# Patient Record
Sex: Female | Born: 1940 | Race: White | Hispanic: No
Health system: Midwestern US, Academic
[De-identification: ages and names within clinical notes are randomized; demographics above are authoritative.]

---

## 2016-11-07 ENCOUNTER — Encounter: Admit: 2016-11-07 | Discharge: 2016-11-07 | Payer: MEDICARE

## 2016-11-07 ENCOUNTER — Ambulatory Visit: Admit: 2016-11-07 | Discharge: 2016-11-08 | Payer: MEDICARE

## 2016-11-07 DIAGNOSIS — I639 Cerebral infarction, unspecified: Principal | ICD-10-CM

## 2016-11-07 DIAGNOSIS — I10 Essential (primary) hypertension: Principal | ICD-10-CM

## 2016-11-07 DIAGNOSIS — E785 Hyperlipidemia, unspecified: ICD-10-CM

## 2016-11-07 DIAGNOSIS — N289 Disorder of kidney and ureter, unspecified: ICD-10-CM

## 2016-11-07 MED ORDER — SPIRONOLACTONE 25 MG PO TAB
50 mg | ORAL_TABLET | Freq: Every day | ORAL | 3 refills | 90.00000 days | Status: AC
Start: 2016-11-07 — End: 2017-02-06

## 2016-11-07 NOTE — Progress Notes
Date of Service: 11/07/2016    Ann Ibarra is a 76 y.o. female.       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  She does not have a device in place so we leaving her on oral anticoagulation because of her stroke risk from recurrent occult A. fib.  When I saw her last I increased her blood pressure meds by adding amlodipine 2.5 mg.  She has had edema develop on higher doses.  I stopped her metoprolol last time because of heart rate in the 40s.  Today her she brings her bandage [place] blood pressure readings in I note no readings below 120 most readings in the 130 range but some into the 140 and even 150 range.  When I saw her last I noted no readings in the 150 range so she is clearly running higher now.    She is not very mobile. She gets around [facility] and is generally satisfied with her quality of life having accepted the limitations.  She has had no new health problems develop since I saw her last.  She has had no sense of recurrence of atrial fib.    She states that she is free from exertional chest pain or pressure that limits activity.  She is having no PND, orthopnea,  edema, tachypalpitations, syncope, near syncope, or postural lightheadedness.  She has had no  focal motor defects, speech defects or cognitive defects that would suggest TIA. She is not limiting her activity in order to avoid chest discomfort or shortness of breath with exertion.         Vitals:    11/07/16 1031 11/07/16 1107   BP: 158/84 162/86   Pulse: 48    Height: 1.626 m (5' 4)      There is no height or weight on file to calculate BMI.     Past Medical History  Patient Active Problem List    Diagnosis Date Noted   ??? Bilateral leg edema 03/09/2015   ??? Persistent atrial fibrillation (HCC) 12/08/2014 atrial fibrillation rapid ventricular response 07/2014 on admitting EKG Strep pneumonia sepsis  [facility]   atrial fibrillation controlled ventricular response MAC OV 12/08/14.  on diltiazem 240, lopressor 25 BID.       ??? Bradycardia, drug induced 11/26/2013     11/26/13 progress note~Pt took excess of labetalol (900mg  instead of 300mg ) and syncope + LOC which has since resolved   01/06/14 Sinus bradycardia rate 45 BP 160/90 on labetalol 100 mg BID, Labetalol DC'd.  MAC OV Atch.     ??? 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  2/02-CT of chest/abdomen:Ao stable in size, 3.8cm AP x 3.4cm transverse stable  c.  06/20/00 CT chest abdomen: Flap visible distal to L subclavian to infrarenal, no change vs 2/02  d.  7/05 CT chest abdomen Ascending: 4 cm,  descending 3.5 cm stable vs 4/04  e.12/10/04 CT Chest/abd: Ascending 4.0, descending 3.5 cm max spans same as 7/05  f. 04/06/06 CT aorta: ascending: 4.0 cm dia, descending 3.5 cm w/ stable Type B dissection,  Prox abd. aorta 2.9 cm  No change when compared to prior 7/06 CT survey  G. August, 2011. The max dia   4.09 cm, stable Type B dissection.   H 07/2012 CT Chest AtchHosp: 4.1 cm ascending aorta, Stable Type B Aortic Dissection  I 02/24/14:  CT Chest Atch: Stable aortic dissection Max Dia Ascending Ao 4.0 cm, Descending thoracic  Ao 3.8 to 4.0 unchanged from 07/2012.   I 3/16 Stable aorta CT chest  [facility] w/ admit eval for pneumonia.          ??? 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  Initial defects: R Hemianopia, hemiparesis,ataxia. Reduced intellectual capacity> CarotidUS-OK b.  2002 Residual defects in reflexes, higher mental function, memory and common sense     ??? Hypertension 10/21/2008      a.  1/02 BP 160+, Zestril increased to 40 BID. labetalol, norvasc, cardura cont'd. BP to 110-20       b.  Began donating blood multiple times annually in 2002, BP normal at donations.  C. 03/2011 Labetalol 600 BID reduced to 300 BID when resting heart rate noted to be 41 on routine MAC OV.      ??? Hyperlipidemia 10/21/2008     a.   7/00-Total 194, Trig 150, HDL 50, LDL 114-Zocor 10mg        b.  03/28/00 total 161 trig 131 HDL 51 LDL 84 AST/ALT ok, Zocor 10,             >change to lipitor 10 mg/d 5/02 for cost reduction       c.  11/23/00 total 144 trig 109 HDL 53 LDL 69  Lipitor 10  D. 08/24/09 total 153 trigs 108 HDL 54 LDL 78 Lipitor 20     ??? Observation for suspected coronary artery disease (CAD) 10/21/2008     a.  07/14/2001 2.5 minute stress echo stopping with legs giving out Submaximal heart rate,             EF 70+ % with improvement with exercise, no ischemia. 6.  GI issues Hiatal hernia       a.  2/02-CT scan of chest small hiatal hernia, abdomen: descending colon diverticulosis     ??? Renal insufficiency 10/21/2008     a.  5/99 Abnormal renal flow scan [facility]: bilateral insufficiency, Left lower pole atrophy               Review of Systems   Constitution: Negative.   HENT: Negative.    Eyes: Negative.    Cardiovascular: Negative.    Respiratory: Negative.    Endocrine: Negative.    Hematologic/Lymphatic: Negative.    Skin: Negative.    Musculoskeletal: Negative.    Gastrointestinal: Negative.    Genitourinary: Negative.    Neurological: Negative.    Psychiatric/Behavioral: Negative.    Allergic/Immunologic: Negative.        Physical Exam  Examined  in wheelchair,   General: Patient in no distress, looks generally healthy. Skin warm and dry, non icteric,    Mucous membranes moist.  Pupils equal and round  Carotids: no bruits    Thyroid not enlarged.  Neck veins: CVP looks normal Respiratory: Breathing comfortably. Lungs clear to percussion . No rales/rhonchi. Scattered late expiratory wheezes.  Cardiac: Regular rhythm.Normal S1 & S2, Fourth heart sound, no rub or S3. No murmur  Abdomen: soft, non-tender,  + bowel sounds.   Motor: Normal muscle strength gaint not tested.  No asymmetric weakness.   Cognitive: Pleasant demeanor. Good insight. No depression   She has 1-2 mm low right leg and ankle edema this is more than the trace I described last visit she also has mild edema on the left not quite as much as on the right.    Cardiovascular Studies  EKG today shows sinus bradycardia rate 48 late transition.    Problems Addressed Today  No diagnosis found.    Assessment and Plan     Ann Ibarra blood pressure is not adequately controlled.  Going to have her double her spironolactone to 50 mg which should help alleviate some of the edema and improve blood pressure control.  Her edema is a bit worse now than it was last visit and I would attribute this to the amlodipine 2.5 mg.  I do not want to boost the amlodipine in this setting.  Hopefully the edema will regress with increased Aldactone.  I have her check a BMP in 10 days and see me again in 3 months for repeat check of blood pressure.  She has issues that we can I can be contacted by the [facility] staff.  Will as we just discussed her blood pressure control looked better last time that it does this time.  The amlodipine 2.5 mg that started last visit is likely contributing to your leg swelling.  I do not think is enough to make it stop the amlodipine but I do not want to increase.    NB: This document was prepared within the Epic(TM) EMR using templates developed by the [facility]. It can be accessed online through [facility] feature by clinicians at other Epic institutions.  The free text in this document, was generated through Dragon(TM) software.  Editing and proofreading were done by the author of this document Dr. Mable Paris MD, [facility] principally at the point of care.  In spite of the author's best effort to identify every air introduced by voice to text dictation, some errors that may represent misspelling or misstatements of what was dictated may persist.  If there are questions about content in this document please contact Dr. Hale Bogus.    The written information I provided Ann Ibarra at the conclusion of today's encounter is as  follows:    Patient Instructions   Her blood pressure control is not good enough and you have more edema than last visit this reason I do not want you to increase the amlodipine past 2.5 mg you are currently taking.    Increase the Aldactone/spironolactone from 25 mg daily to 50 mg daily this should reduce blood pressure and help clear ankle fluid.    You need a BMP blood test in about 10 days to see how your potassium reading looks on the higher dose Spironolactone.    See me again in about 3 months for recheck of blood pressure and assessing chemistry have a BMP blood test a day or 2 before you come in to see me so I can see those results as well in September.    Call in if you have problems or questions.   Marissa Nestle, MD  no checked on the computer at right I think were good problems will make to copies of the printout so they can at 1 2 up to [facility] used this dictation I get everything done  every dictate a symptom off track thinks it otherwise please about as long to do that at the estimate notes and read them later so it is faster or accurate but I have to get the computer to work this is the only computer work is only computer microphone so the emergency room the day before your birthday             Current Medications (including today's revisions)  ??? acetaminophen (TYLENOL) 325 mg tablet Take 650 mg by mouth twice daily.   ??? amLODIPine (NORVASC) 2.5 mg tablet Take 1 tablet by mouth daily. ??? atorvastatin (LIPITOR) 20 mg tablet Take 20 mg by mouth daily.   ??? benzonatate (TESSALON) 200 mg capsule Take 200 mg by mouth three times daily as needed for Cough.   ??? diphenhydrAMINE (BENADRYL) 50 mg capsule Take 50 mg by mouth every 6 hours as needed.   ??? docusate (COLACE) 100 mg capsule Take 100 mg by mouth twice daily as needed for Constipation.   ??? doxazosin (CARDURA) 2 mg tablet Take 1 Tab by mouth twice daily.   ??? ferrous sulfate 325 mg (65 mg iron) tablet Take 1 Tab by mouth three times daily. On 7/19, start taking three times a dya (Patient taking differently: Take 325 mg by mouth daily. On 7/19, start taking three times a dya)   ??? furosemide (LASIX) 40 mg tablet Take 40 mg by mouth every morning.   ??? guaiFENesin (ROBITUSSIN) 100 mg/5 mL oral solution Take 5 mL by mouth every 4 hours as needed. Indications: COUGH   ??? guaiFENesin LA (MUCINEX) 600 mg tablet Take 600 mg by mouth twice daily.   ??? HYDROcodone/acetaminophen (NORCO) 5-325 mg tablet Take 1 Tab by mouth three times daily as needed for Pain    ??? ipratropium (ATROVENT) 0.02 % nebulizer solution Inhale 0.5 mg by mouth every 2 hours as needed.   ??? LOSARTAN PO Take 100 mg by mouth daily.   ??? MAGNESIUM HYDROXIDE (MILK OF MAGNESIA PO) Take 10 mL by mouth daily as needed.   ??? melatonin 3 mg tab Take 3 mg by mouth at bedtime as needed.   ??? metoprolol tartrate (LOPRESSOR) 25 mg tablet Take 0.5 tablets by mouth twice daily.   ??? rivaroxaban (XARELTO) 20 mg tablet Take 1 Tab by mouth daily.   ??? spironolactone (ALDACTONE) 25 mg tablet Take 1 tablet by mouth daily. Take with food.   ??? vitamin A and D oint Apply  topically to affected area twice daily.

## 2016-11-13 ENCOUNTER — Encounter: Admit: 2016-11-13 | Discharge: 2016-11-13 | Payer: MEDICARE

## 2016-11-13 MED ORDER — XARELTO 20 MG PO TAB
ORAL_TABLET | Freq: Every day | ORAL | 11 refills | 30.00000 days | Status: AC
Start: 2016-11-13 — End: 2017-10-26

## 2016-12-12 ENCOUNTER — Encounter: Admit: 2016-12-12 | Discharge: 2016-12-12 | Payer: MEDICARE

## 2016-12-12 MED ORDER — DOXAZOSIN 2 MG PO TAB
2 mg | ORAL_TABLET | Freq: Two times a day (BID) | ORAL | 3 refills | 30.00000 days | Status: AC
Start: 2016-12-12 — End: 2017-11-26

## 2017-01-16 LAB — COMPREHENSIVE METABOLIC PANEL
Lab: 0.7
Lab: 13
Lab: 145
Lab: 3.9
Lab: 6.4
Lab: 7
Lab: 91

## 2017-01-16 LAB — CBC: Lab: 6.4

## 2017-01-16 LAB — LIPID PROFILE
Lab: 132 — ABNORMAL LOW (ref 150–200)
Lab: 36 — ABNORMAL LOW (ref 23–31)
Lab: 37 — ABNORMAL LOW (ref 33.0–37.0)
Lab: 4
Lab: 58 — ABNORMAL HIGH (ref 9.8–20.1)

## 2017-01-16 LAB — HEMOGLOBIN A1C: Lab: 5.4 — ABNORMAL HIGH (ref 98–107)

## 2017-01-23 ENCOUNTER — Encounter: Admit: 2017-01-23 | Discharge: 2017-01-23 | Payer: MEDICARE

## 2017-02-06 ENCOUNTER — Ambulatory Visit: Admit: 2017-02-06 | Discharge: 2017-02-07 | Payer: MEDICARE

## 2017-02-06 ENCOUNTER — Encounter: Admit: 2017-02-06 | Discharge: 2017-02-06 | Payer: MEDICARE

## 2017-02-06 DIAGNOSIS — I10 Essential (primary) hypertension: Principal | ICD-10-CM

## 2017-02-06 DIAGNOSIS — I639 Cerebral infarction, unspecified: Principal | ICD-10-CM

## 2017-02-06 DIAGNOSIS — N289 Disorder of kidney and ureter, unspecified: ICD-10-CM

## 2017-02-06 DIAGNOSIS — E785 Hyperlipidemia, unspecified: ICD-10-CM

## 2017-02-06 DIAGNOSIS — R001 Bradycardia, unspecified: Secondary | ICD-10-CM

## 2017-02-06 DIAGNOSIS — R600 Localized edema: ICD-10-CM

## 2017-02-06 MED ORDER — METOPROLOL TARTRATE 25 MG PO TAB
12.5 mg | ORAL_TABLET | Freq: Every day | ORAL | 3 refills | 90.00000 days | Status: AC
Start: 2017-02-06 — End: 2017-03-12

## 2017-02-06 MED ORDER — SPIRONOLACTONE 25 MG PO TAB
75 mg | ORAL_TABLET | Freq: Every day | ORAL | 3 refills | 90.00000 days | Status: AC
Start: 2017-02-06 — End: 2017-09-14

## 2017-02-06 NOTE — Progress Notes
Date of Service: 02/06/2017    Ann Ibarra is a 76 y.o. female.       HPI     Ann Ibarra presents today for evaluation after I had her add higher dose of Aldactone to better control blood pressure and reduce edema.  Her blood pressure readings where she she is staying have improved.  Today's reading at 132/80 in the left arm is exactly 30 points lower than the systolic previously obtained with reading on the right arm can confirmatory at 128/78.  She is having no ill effects from the Aldactone.  She notes that her blood pressure readings at [facility] have also been quite satisfactory with the predominant readings in the 120s and 130s and only very occasional readings over 140 she is not having any readings under 100.  It has been noted to her by the night nurses at [facility] that she has slow heart rates that the nursing staff has hoped could be managed by reducing her beta-blocker.    Note in the past that she has had bradycardia down to the heart rates in the 40s 3 years ago when she was on labetalol 100 twice daily.  I subsequently switched her to Lopressor which she is taking in a minimal dose now 12.5 twice daily.         Vitals:    02/06/17 1010 02/06/17 1024   BP: 128/78 132/80   Pulse:  43   Height: 1.626 m (5' 4)      Body mass index is 37.93 kg/m???.     Past Medical History  Patient Active Problem List    Diagnosis Date Noted   ??? Bilateral leg edema 03/09/2015   ??? Persistent atrial fibrillation (HCC) 12/08/2014     atrial fibrillation rapid ventricular response 07/2014 on admitting EKG Strep pneumonia sepsis  [facility]   atrial fibrillation controlled ventricular response MAC OV 12/08/14.  on diltiazem 240, lopressor 25 BID.       ??? Bradycardia, drug induced 11/26/2013     11/26/13 progress note~Pt took excess of labetalol (900mg  instead of 300mg ) and syncope + LOC which has since resolved   01/06/14 Sinus bradycardia rate 45 BP 160/90 on labetalol 100 mg BID, Labetalol DC'd.  MAC OV Atch. ??? 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  2/02-CT of chest/abdomen:Ao stable in size, 3.8cm AP x 3.4cm transverse stable  c.  06/20/00 CT chest abdomen: Flap visible distal to L subclavian to infrarenal, no change vs 2/02  d.  7/05 CT chest abdomen Ascending: 4 cm,  descending 3.5 cm stable vs 4/04  e.12/10/04 CT Chest/abd: Ascending 4.0, descending 3.5 cm max spans same as 7/05  f. 04/06/06 CT aorta: ascending: 4.0 cm dia, descending 3.5 cm w/ stable Type B dissection,                Prox abd. aorta 2.9 cm  No change when compared to prior 7/06 CT survey  G. August, 2011. The max dia   4.09 cm, stable Type B dissection.   H 07/2012 CT Chest AtchHosp: 4.1 cm ascending aorta, Stable Type B Aortic Dissection  I 02/24/14:  CT Chest Atch: Stable aortic dissection Max Dia Ascending Ao 4.0 cm, Descending thoracic  Ao 3.8 to  4.0 unchanged from 07/2012.   I 3/16 Stable aorta CT chest  [facility] w/ admit eval for pneumonia.          ??? 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  Initial defects: R Hemianopia, hemiparesis,ataxia. Reduced intellectual capacity> CarotidUS-OK       b.  2002 Residual defects in reflexes, higher mental function, memory and common sense     ??? Hypertension 10/21/2008      a.  1/02 BP 160+, Zestril increased to 40 BID. labetalol, norvasc, cardura cont'd. BP to 110-20       b.  Began donating blood multiple times annually in 2002, BP normal at donations.  C. 03/2011 Labetalol 600 BID reduced to 300 BID when resting heart rate noted to be 41 on routine MAC OV.      ??? Hyperlipidemia 10/21/2008     a.   7/00-Total 194, Trig 150, HDL 50, LDL 114-Zocor 10mg  b.  03/28/00 total 161 trig 131 HDL 51 LDL 84 AST/ALT ok, Zocor 10,             >change to lipitor 10 mg/d 5/02 for cost reduction       c.  11/23/00 total 144 trig 109 HDL 53 LDL 69  Lipitor 10  D. 08/24/09 total 153 trigs 108 HDL 54 LDL 78 Lipitor 20     ??? Observation for suspected coronary artery disease (CAD) 10/21/2008     a.  07/14/2001 2.5 minute stress echo stopping with legs giving out Submaximal heart rate,             EF 70+ % with improvement with exercise, no ischemia. 6.  GI issues Hiatal hernia       a.  2/02-CT scan of chest small hiatal hernia, abdomen: descending colon diverticulosis     ??? Renal insufficiency 10/21/2008     a.  5/99 Abnormal renal flow scan [facility]: bilateral insufficiency, Left lower pole atrophy               Review of Systems   Constitution: Negative.   HENT: Negative.    Eyes: Negative.    Cardiovascular: Negative.    Respiratory: Negative.    Endocrine: Negative.    Hematologic/Lymphatic: Negative.    Skin: Negative.    Musculoskeletal: Negative.    Gastrointestinal: Negative.    Genitourinary: Negative.    Neurological: Negative.    Psychiatric/Behavioral: Negative.    Allergic/Immunologic: Negative.        Physical Exam  Examined  in wheelchair,   General: Patient in no distress, looks generally healthy. Skin warm and dry, non icteric,    Mucous membranes moist.  Pupils equal and round  Carotids: no bruits    Thyroid not enlarged.  Neck veins: CVP looks normal  Respiratory: Breathing comfortably. Lungs clear to percussion . No rales/rhonchi. Scattered late expiratory wheezes.  Cardiac: Basically slow regular rhythm with occasional prematurity..Normal S1 & S2, Fourth heart sound, no rub or S3. No murmur  Abdomen: soft, non-tender,  + bowel sounds.   Motor: Normal muscle strength gaint not tested.  No asymmetric weakness.   Cognitive: Pleasant demeanor. Good insight. No depression   She has minimal bilateral edema.  Its most prominent right above the top of her compression socks.  Cardiovascular Studies  Her chemistry from September 4 that shows mid range potassium  and satisfactory creatinine.   01/16/2017 00:00   Sodium 145   Potassium 4.5   Chloride 112 (H)   CO2 22.0 (L)   Anion Gap 16 (H)   Blood Urea Nitrogen 35.0 (H)   Creatinine 1.03   eGFR Non African American 52.9   eGFR African American 61.0   Glucose 164 (H)   Albumin 3.9   Calcium 9.7   Total Bilirubin 0.70   Total Protein 6.4   Hemoglobin A1C 5.4   AST (SGOT) 13   ALT (SGPT) 7   Alk Phosphatase 91   Cholesterol 132 (L)   Triglycerides 183   HDL 36   LDL 58       Problems Addressed Today  No diagnosis found.    Assessment and Plan     Mrs. Chalmers has well-controlled blood pressure but her heart rate is steadily slowing.  She is getting new alerts from her staff at [facility] about bradycardia at night.  Even the she is asymptomatic at this point it would take a little to cause alarms if she indicated any symptoms at all referable to bradycardia.     With her bradycardia is extremely difficult to find alternative drugs to add as we have already taken her off of labetalol because of bradycardia which is a combination vasodilator beta-blocker with very little beta-blocker effect.      In this setting I am going to reluctantly reduce her Lopressor to 12.5 mg a.m. and add Aldactone is slightly higher dose increased to 75 mg daily to try to maintain blood pressure and heart rate more satisfactorily.  Signed an order on her follow-up sheet for her to have BMP readings in 10 days and 20 days to track potassium on the slightly higher dose of Aldactone.  I will see her again in 2 months sooner if the need arises.      NB: This document was prepared within the Epic(TM) EMR using templates developed by the [facility]. It can be accessed online through [facility] feature by clinicians at other Epic institutions.  The free text in this document, was generated through Dragon(TM) software.  Editing and proofreading were done by the author of this document Dr. Mable Paris MD, [facility] principally at the point of care.  In spite of the author's best effort to identify every error introduced by voice to text dictation, some errors that may represent misspelling or misstatements of what was dictated may persist.  If there are questions about content in this document please contact Dr. Hale Bogus.    The written information I provided Ms. Broski at the conclusion of today's encounter is as  follows:   Patient Instructions   Your heart rate has been declining steadily over the last couple of years.  I can see the handwriting on the wall that the heart rates going to drop down or you will have a heart rate in the 40s with a little dizziness and the [place] people will tell you you have to stop the Lopressor and though quit giving it to you.  What I want to do is reduce the Lopressor to a simple 12.5 mg dose each morning and have you increase the Aldactone to 3 tablets daily.  Those 2 to get those 2 changes together may leave you with a slightly higher heart rate and the same blood pressure control.  We need to check your chemistry in 10 days and 20 days to see  what your potassium is doing on the higher dose of Aldactone.    Your leg swelling looks good everything else seems fine.    All hope that this works out.    Return for a recheck in two months.    I can see you sooner if needed.   Call in if you have problems or questions.  My nurses are Brett Canales or Misty Stanley, try to reach   directly if something really needs attention  Marissa Nestle, MD                 Current Medications (including today's revisions)  ??? acetaminophen (TYLENOL) 325 mg tablet Take 650 mg by mouth twice daily.   ??? acetaminophen SR(+) (ARTHRITIS PAIN RELIEF) 650 mg tablet Take 650 mg by mouth every 6 hours as needed for Pain.   ??? amLODIPine (NORVASC) 2.5 mg tablet Take 1 tablet by mouth daily. ??? atorvastatin (LIPITOR) 20 mg tablet Take 20 mg by mouth daily.   ??? benzonatate (TESSALON) 200 mg capsule Take 200 mg by mouth three times daily as needed for Cough.   ??? diphenhydrAMINE (BENADRYL) 50 mg capsule Take 50 mg by mouth every 6 hours as needed.   ??? docusate (COLACE) 100 mg capsule Take 100 mg by mouth twice daily as needed for Constipation.   ??? doxazosin (CARDURA) 2 mg tablet Take 1 tablet by mouth twice daily.   ??? ferrous sulfate 325 mg (65 mg iron) tablet Take 1 Tab by mouth three times daily. On 7/19, start taking three times a dya (Patient taking differently: Take 325 mg by mouth daily. On 7/19, start taking three times a dya)   ??? furosemide (LASIX) 40 mg tablet Take 40 mg by mouth every morning.   ??? guaiFENesin (ROBITUSSIN) 100 mg/5 mL oral solution Take 5 mL by mouth every 4 hours as needed. Indications: COUGH   ??? guaiFENesin LA (MUCINEX) 600 mg tablet Take 600 mg by mouth twice daily.   ??? HYDROcodone/acetaminophen (NORCO) 5-325 mg tablet Take 1 Tab by mouth three times daily as needed for Pain    ??? ipratropium (ATROVENT) 0.02 % nebulizer solution Inhale 0.5 mg by mouth every 2 hours as needed.   ??? loperamide (IMODIUM A-D) 2 mg capsule Take 2 mg by mouth as Needed for Diarrhea. Take 2 capsules by mouth initially, followed by 1 capsule by mouth after each loose stool up to a maximum of 8 tablets in 24 hours.   ??? LOSARTAN PO Take 100 mg by mouth daily.   ??? MAGNESIUM HYDROXIDE (MILK OF MAGNESIA PO) Take 10 mL by mouth daily as needed.   ??? melatonin 3 mg tab Take 3 mg by mouth at bedtime as needed.   ??? metoprolol tartrate (LOPRESSOR) 25 mg tablet Take 0.5 tablets by mouth twice daily.   ??? spironolactone (ALDACTONE) 25 mg tablet Take 2 tablets by mouth daily. Take with food.   ??? vitamin A and D oint Apply  topically to affected area twice daily.   ??? XARELTO 20 mg tablet TAKE 1 TABLET BY MOUTH DAILY.

## 2017-02-15 LAB — BASIC METABOLIC PANEL
Lab: 0.7
Lab: 112 — ABNORMAL HIGH (ref 98–107)
Lab: 119 — ABNORMAL HIGH (ref 83–110)
Lab: 14
Lab: 143
Lab: 21 — ABNORMAL HIGH (ref 9.8–20.1)
Lab: 21 — ABNORMAL LOW (ref 23–31)
Lab: 4
Lab: 9.3

## 2017-02-27 ENCOUNTER — Encounter: Admit: 2017-02-27 | Discharge: 2017-02-27 | Payer: MEDICARE

## 2017-02-27 DIAGNOSIS — I10 Essential (primary) hypertension: Principal | ICD-10-CM

## 2017-02-27 DIAGNOSIS — R001 Bradycardia, unspecified: ICD-10-CM

## 2017-02-27 LAB — BASIC METABOLIC PANEL
Lab: 22 — ABNORMAL HIGH (ref 9.8–20.1)
Lab: 4.3
Lab: 110 — ABNORMAL HIGH (ref 98–107)
Lab: 143
Lab: 22 — ABNORMAL LOW (ref 23–31)

## 2017-03-12 ENCOUNTER — Encounter: Admit: 2017-03-12 | Discharge: 2017-03-12 | Payer: MEDICARE

## 2017-03-12 MED ORDER — METOPROLOL TARTRATE 25 MG PO TAB
ORAL_TABLET | Freq: Every morning | ORAL | 3 refills | 90.00000 days | Status: AC
Start: 2017-03-12 — End: 2017-06-18

## 2017-03-27 ENCOUNTER — Encounter: Admit: 2017-03-27 | Discharge: 2017-03-27 | Payer: MEDICARE

## 2017-03-27 DIAGNOSIS — R001 Bradycardia, unspecified: ICD-10-CM

## 2017-03-27 DIAGNOSIS — I10 Essential (primary) hypertension: Principal | ICD-10-CM

## 2017-04-24 ENCOUNTER — Ambulatory Visit: Admit: 2017-04-24 | Discharge: 2017-04-25 | Payer: MEDICARE

## 2017-04-24 ENCOUNTER — Encounter: Admit: 2017-04-24 | Discharge: 2017-04-24 | Payer: MEDICARE

## 2017-04-24 DIAGNOSIS — I10 Essential (primary) hypertension: ICD-10-CM

## 2017-04-24 DIAGNOSIS — E785 Hyperlipidemia, unspecified: ICD-10-CM

## 2017-04-24 DIAGNOSIS — N289 Disorder of kidney and ureter, unspecified: ICD-10-CM

## 2017-04-24 DIAGNOSIS — R001 Bradycardia, unspecified: Principal | ICD-10-CM

## 2017-04-24 DIAGNOSIS — I4819 Other persistent atrial fibrillation: ICD-10-CM

## 2017-04-24 DIAGNOSIS — I639 Cerebral infarction, unspecified: Principal | ICD-10-CM

## 2017-04-24 NOTE — Progress Notes
Date of Service: 04/24/2017    Ann Ibarra is a 76 y.o. female.       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  [facility] is now at [facility].  She is nonambulatory but feels that she is adapted to her limitations well and has a pretty good quality of life.  She has had an upper respiratory tract a infection syndrome with persisting cough that has not resolved in spite of Mucomyst and benzonatate.  In reviewing her med list I see that she is on losartan for hypertension and not an ACE inhibitor.  She has had no fevers chills or sweats.  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  The cough is nonproductive.    When I saw her 3 months ago I was concerned with her heart rate 43 in sinus rhythm.  I had her cut her Toprol back in increased her Aldactone.  She is done well on that program.    Other than this her health care status since I seen her last he has been uneventful.  She has had difficult to control hypertension in the past but on the current program we have established over time her pressures are generally in the 130 range at [facility].  When she saw that her reading was 158/88 with initial recheck couple of minutes later 152/84 she thought this was anxiety related artifact from my trip to the hospital.  On recheck 30 minutes later by the same med tech who I checked her initially her pressures were down 20 points to 130.    I reviewed her imaging records and see that her last check of her ascending aorta by CT was in early 2016.  The aorta was stable with readings in the 4.0 cm range essentially what we have seen over the last many years.  She has had no symptoms suggesting aortic instability.  She has a distant history of atrial fibrillation but no current antiarrhythmic or anticoagulant therapy.         Vitals:    04/24/17 1254 04/24/17 1258   BP: 158/88 152/84   Pulse: 63    Height: 1.626 m (5' 4)      Body mass index is 37.93 kg/m???.     Past Medical History  Patient Active Problem List    Diagnosis Date Noted   ??? Bilateral leg edema 03/09/2015   ??? Persistent atrial fibrillation (HCC) 12/08/2014     atrial fibrillation rapid ventricular response 07/2014 on admitting EKG Strep pneumonia sepsis  [facility]   atrial fibrillation controlled ventricular response MAC OV 12/08/14.  on diltiazem 240, lopressor 25 BID.       ??? Bradycardia, drug induced 11/26/2013     11/26/13 progress note~Pt took excess of labetalol (900mg  instead of 300mg ) and syncope + LOC which has since resolved   01/06/14 Sinus bradycardia rate 45 BP 160/90 on labetalol 100 mg BID, Labetalol DC'd.  MAC OV [facility].     ??? 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  2/02-CT of chest/abdomen:Ao stable in size, 3.8cm AP x 3.4cm transverse stable  c.  06/20/00 CT chest abdomen: Flap visible distal to L subclavian to infrarenal, no change vs 2/02  d.  7/05 CT chest abdomen Ascending: 4 cm,  descending 3.5 cm stable vs 4/04  e.12/10/04 CT Chest/abd: Ascending 4.0, descending 3.5 cm max spans same as 7/05  f. 04/06/06 CT aorta: ascending: 4.0 cm dia, descending 3.5 cm w/ stable Type B dissection,                Prox abd. aorta 2.9 cm  No change when compared to prior 7/06 CT survey  G. August, 2011. The max dia   4.09 cm, stable Type B dissection.   H 07/2012 CT Chest [facility]: 4.1 cm ascending aorta, Stable Type B Aortic Dissection  I 02/24/14:  CT Chest [facility]: Stable aortic dissection Max Dia Ascending Ao 4.0 cm, Descending thoracic  Ao 3.8 to 4.0 unchanged from 07/2012.   I 3/16 Stable aorta CT chest  [facility] w/ admit eval for pneumonia.          ??? 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  Initial defects: R Hemianopia, hemiparesis,ataxia. Reduced intellectual capacity> CarotidUS-OK       b.  2002 Residual defects in reflexes, higher mental function, memory and common sense     ??? Hypertension 10/21/2008      a.  1/02 BP 160+, Zestril increased to 40 BID. labetalol, norvasc, cardura cont'd. BP to 110-20       b.  Began donating blood multiple times annually in 2002, BP normal at donations.  C. 03/2011 Labetalol 600 BID reduced to 300 BID when resting heart rate noted to be 41 on routine MAC OV.      ??? Hyperlipidemia 10/21/2008     a.   7/00-Total 194, Trig 150, HDL 50, LDL 114-Zocor 10mg        b.  03/28/00 total 161 trig 131 HDL 51 LDL 84 AST/ALT ok, Zocor 10,             >change to lipitor 10 mg/d 5/02 for cost reduction       c.  11/23/00 total 144 trig 109 HDL 53 LDL 69  Lipitor 10  D. 08/24/09 total 153 trigs 108 HDL 54 LDL 78 Lipitor 20     ??? Observation for suspected coronary artery disease (CAD) 10/21/2008     a.  07/14/2001 2.5 minute stress echo stopping with legs giving out Submaximal heart rate,             EF 70+ % with improvement with exercise, no ischemia. 6.  GI issues Hiatal hernia       a.  2/02-CT scan of chest small hiatal hernia, abdomen: descending colon diverticulosis     ??? Renal insufficiency 10/21/2008     a.  5/99 Abnormal renal flow scan [facility]: bilateral insufficiency, Left lower pole atrophy               Review of Systems   Constitution: Negative.   HENT: Positive for congestion.    Eyes: Negative.    Cardiovascular: Negative.    Respiratory: Positive for cough and wheezing.    Endocrine: Negative.    Hematologic/Lymphatic: Negative.    Skin: Negative. Musculoskeletal: Negative.    Gastrointestinal: Negative.  Genitourinary: Negative.    Neurological: Negative.    Psychiatric/Behavioral: Negative.    Allergic/Immunologic: Negative.        Physical Exam  Examined  in wheelchair,   General: Patient in no distress, looks generally healthy. Skin warm and dry, non icteric,    Mucous membranes moist.  Pupils equal and round  Carotids: no bruits    Thyroid not enlarged.  Neck veins: CVP looks normal  Respiratory: Breathing comfortably. Lungs clear to percussion . No rales/rhonchi. Scattered late expiratory wheezes.  Cardiac: Basically slow regular rhythm with occasional prematurity..Normal S1 & S2, Fourth heart sound, no rub or S3. No murmur  Abdomen: soft, non-tender,  + bowel sounds.   Motor: Normal muscle strength gaint not tested.  No asymmetric weakness.   Cognitive: Pleasant demeanor. Good insight. No depression   She has minimal if any leg edema. She is wearing tight socke to upper calf not  compression socks    Cardiovascular Studies  EKG shows sinus rhythm rate 63 delayed R wave transition otherwise unremarkable.    Problems Addressed Today  No diagnosis found.    Assessment and Plan     Choline his heart rate is now above 60 with minimal beta-blocker residual dosing.  At some point when her heart rate drops into the 40s or 50s on the Toprol-XL 12.5 will discontinue it but for now her blood pressure control is good and I do not want to change anything.      With her heart rate up 15-20 beats from where she was when I saw her in September and blood pressures consistently in the 130s at [facility] and dropped to 130 from 150 today in clinic after she did settle down there is no reason to alter her blood pressure meds.    As we're approaching 20 years out from initial follow-up of her dilated ascending aorta  with dissection there is no clear guideline on optimal timing of interval CTs.  I will let her wait until next spring when I see her in about 6 months for her next CT scan.  That will be about 3 years after her last scan but with minimal evidence of progressive disease and dilation over the past 17 years there is no reason to think that we need to get the scan more promptly than on about the 20th anniversary of her aortic dissection in the.  I will see her again in May 2019.  I like her to get a CT scan without contrast of her thoracic aorta shortly before that so I will have the report available when I see her.  She will continue to have her blood pressure and heart rate monitored at [facility].    I do not think there is anything to do about her bronchitis syndrome.  I do not think she needs antibiotics for this and neither does she.      NB: The free text in this document was generated through Dragon(TM) software with editing and proofreading  done by the author of this document Dr. Mable Paris MD, [facility] principally at the point of care. Some errors may persist.  If there are questions about content in this document please contact Dr. Hale Bogus.    The written information I provided Ms. Mcclenathan at the conclusion of today's encounter is as  follows:   Patient Instructions   Overall things look good.  Your heart rate is up and your blood pressure is down.  I am not  changing any of your medications.  I thought the beta-blocker was slowing her heart down too much.  With slight reduction your heart rates in the 60s now instead of the 40s last visit.    Need to have a CT scan next spring.  Having this done in April or May will be about 20years since her initial stroke.  I think at the time I started following you in 1999 the idea that you would go 20 years with no further complications would have been a hope that we would have thought would likely not come true.    Let Dr. Larina Bras know if you start having a fever or coughing up of the phlegm.  Right now I think you just have bronchitis.  It is best to avoid decongestants since those can raise your heart rate and blood pressure in an unpredictable manner.    I look forward to seeing you again in May with a CT scan having been done a week or 2 before hand so I will have the results.  If something goes wrong with heart rate blood pressure anything else Dr. Larina Bras thinks I should see you about I will be happy to do so early.                 Current Medications (including today's revisions)  ??? acetaminophen (TYLENOL) 325 mg tablet Take 650 mg by mouth twice daily.   ??? acetaminophen SR(+) (ARTHRITIS PAIN RELIEF) 650 mg tablet Take 650 mg by mouth every 6 hours as needed for Pain.   ??? amLODIPine (NORVASC) 2.5 mg tablet Take 1 tablet by mouth daily.   ??? atorvastatin (LIPITOR) 20 mg tablet Take 20 mg by mouth daily.   ??? benzonatate (TESSALON) 200 mg capsule Take 200 mg by mouth three times daily as needed for Cough.   ??? diphenhydrAMINE (BENADRYL) 50 mg capsule Take 50 mg by mouth every 6 hours as needed.   ??? docusate (COLACE) 100 mg capsule Take 100 mg by mouth twice daily as needed for Constipation.   ??? doxazosin (CARDURA) 2 mg tablet Take 1 tablet by mouth twice daily.   ??? furosemide (LASIX) 40 mg tablet Take 40 mg by mouth every morning.   ??? guaiFENesin (ROBITUSSIN) 100 mg/5 mL oral solution Take 5 mL by mouth every 4 hours as needed. Indications: COUGH   ??? guaiFENesin LA (MUCINEX) 600 mg tablet Take 600 mg by mouth twice daily.   ??? HYDROcodone/acetaminophen (NORCO) 5-325 mg tablet Take 1 Tab by mouth three times daily as needed for Pain    ??? ipratropium (ATROVENT) 0.02 % nebulizer solution Inhale 0.5 mg by mouth every 2 hours as needed.   ??? loperamide (IMODIUM A-D) 2 mg capsule Take 2 mg by mouth as Needed for Diarrhea. Take 2 capsules by mouth initially, followed by 1 capsule by mouth after each loose stool up to a maximum of 8 tablets in 24 hours.   ??? LOSARTAN PO Take 100 mg by mouth daily.   ??? MAGNESIUM HYDROXIDE (MILK OF MAGNESIA PO) Take 10 mL by mouth daily as needed.   ??? melatonin 3 mg tab Take 3 mg by mouth at bedtime as needed.   ??? metoprolol tartrate (LOPRESSOR) 25 mg tablet TAKE 1/2 TABLET BY MOUTH EVERY MORNING   ??? spironolactone (ALDACTONE) 25 mg tablet Take three tablets by mouth daily. Take with food.   ??? vitamin A and D oint Apply  topically to affected area twice daily.   ??? XARELTO  20 mg tablet TAKE 1 TABLET BY MOUTH DAILY.

## 2017-06-18 ENCOUNTER — Encounter: Admit: 2017-06-18 | Discharge: 2017-06-18 | Payer: MEDICARE

## 2017-06-18 MED ORDER — METOPROLOL TARTRATE 25 MG PO TAB
12.5 mg | ORAL_TABLET | Freq: Every day | ORAL | 3 refills | 90.00000 days | Status: AC
Start: 2017-06-18 — End: 2017-10-24

## 2017-07-12 ENCOUNTER — Encounter: Admit: 2017-07-12 | Discharge: 2017-07-12 | Payer: MEDICARE

## 2017-07-13 MED ORDER — AMLODIPINE 2.5 MG PO TAB
ORAL_TABLET | Freq: Every day | 3 refills | Status: AC
Start: 2017-07-13 — End: 2018-01-22

## 2017-09-12 ENCOUNTER — Encounter: Admit: 2017-09-12 | Discharge: 2017-09-12 | Payer: MEDICARE

## 2017-09-14 ENCOUNTER — Encounter: Admit: 2017-09-14 | Discharge: 2017-09-14 | Payer: MEDICARE

## 2017-09-14 MED ORDER — SPIRONOLACTONE 25 MG PO TAB
ORAL_TABLET | Freq: Every day | ORAL | 3 refills | 46.00000 days | Status: AC
Start: 2017-09-14 — End: 2018-01-22

## 2017-10-12 ENCOUNTER — Encounter: Admit: 2017-10-12 | Discharge: 2017-10-12 | Payer: MEDICARE

## 2017-10-12 DIAGNOSIS — I712 Thoracic aortic aneurysm, without rupture: Principal | ICD-10-CM

## 2017-10-19 ENCOUNTER — Encounter: Admit: 2017-10-19 | Discharge: 2017-10-19 | Payer: MEDICARE

## 2017-10-23 ENCOUNTER — Encounter: Admit: 2017-10-23 | Discharge: 2017-10-23 | Payer: MEDICARE

## 2017-10-23 ENCOUNTER — Ambulatory Visit: Admit: 2017-10-23 | Discharge: 2017-10-24 | Payer: MEDICARE

## 2017-10-23 DIAGNOSIS — I712 Thoracic aortic aneurysm, without rupture: ICD-10-CM

## 2017-10-23 DIAGNOSIS — E7800 Pure hypercholesterolemia, unspecified: Principal | ICD-10-CM

## 2017-10-23 DIAGNOSIS — I639 Cerebral infarction, unspecified: Principal | ICD-10-CM

## 2017-10-23 DIAGNOSIS — I10 Essential (primary) hypertension: ICD-10-CM

## 2017-10-23 DIAGNOSIS — N289 Disorder of kidney and ureter, unspecified: ICD-10-CM

## 2017-10-23 DIAGNOSIS — I4819 Other persistent atrial fibrillation: ICD-10-CM

## 2017-10-23 DIAGNOSIS — R001 Bradycardia, unspecified: ICD-10-CM

## 2017-10-23 DIAGNOSIS — E785 Hyperlipidemia, unspecified: ICD-10-CM

## 2017-10-23 DIAGNOSIS — R600 Localized edema: ICD-10-CM

## 2017-10-24 ENCOUNTER — Encounter: Admit: 2017-10-24 | Discharge: 2017-10-24 | Payer: MEDICARE

## 2017-10-24 MED ORDER — METOPROLOL TARTRATE 25 MG PO TAB
ORAL_TABLET | Freq: Every day | ORAL | 11 refills | 90.00000 days | Status: AC
Start: 2017-10-24 — End: 2018-10-01

## 2017-10-26 ENCOUNTER — Encounter: Admit: 2017-10-26 | Discharge: 2017-10-26 | Payer: MEDICARE

## 2017-10-26 MED ORDER — XARELTO 20 MG PO TAB
ORAL_TABLET | Freq: Every day | ORAL | 11 refills | 30.00000 days | Status: AC
Start: 2017-10-26 — End: 2018-10-29

## 2017-11-01 LAB — BASIC METABOLIC PANEL
Lab: 109 — ABNORMAL HIGH (ref 98–107)
Lab: 142
Lab: 22 — ABNORMAL LOW (ref 23–31)
Lab: 35 — ABNORMAL HIGH (ref 9.8–20.1)
Lab: 4.8

## 2017-11-05 ENCOUNTER — Encounter: Admit: 2017-11-05 | Discharge: 2017-11-05 | Payer: MEDICARE

## 2017-11-26 ENCOUNTER — Encounter: Admit: 2017-11-26 | Discharge: 2017-11-26 | Payer: MEDICARE

## 2017-11-26 MED ORDER — DOXAZOSIN 2 MG PO TAB
2 mg | ORAL_TABLET | Freq: Two times a day (BID) | ORAL | 3 refills | 30.00000 days | Status: AC
Start: 2017-11-26 — End: 2018-11-11

## 2018-01-08 ENCOUNTER — Encounter: Admit: 2018-01-08 | Discharge: 2018-01-08 | Payer: MEDICARE

## 2018-01-22 ENCOUNTER — Encounter: Admit: 2018-01-22 | Discharge: 2018-01-22 | Payer: MEDICARE

## 2018-01-22 ENCOUNTER — Ambulatory Visit: Admit: 2018-01-22 | Discharge: 2018-01-23 | Payer: MEDICARE

## 2018-01-22 DIAGNOSIS — I10 Essential (primary) hypertension: Principal | ICD-10-CM

## 2018-01-22 DIAGNOSIS — I639 Cerebral infarction, unspecified: Principal | ICD-10-CM

## 2018-01-22 DIAGNOSIS — E785 Hyperlipidemia, unspecified: ICD-10-CM

## 2018-01-22 DIAGNOSIS — N289 Disorder of kidney and ureter, unspecified: ICD-10-CM

## 2018-01-22 DIAGNOSIS — E782 Mixed hyperlipidemia: ICD-10-CM

## 2018-01-22 DIAGNOSIS — R001 Bradycardia, unspecified: ICD-10-CM

## 2018-01-22 MED ORDER — AMLODIPINE 2.5 MG PO TAB
2.5 mg | ORAL_TABLET | Freq: Every day | ORAL | 3 refills | Status: AC
Start: 2018-01-22 — End: 2018-11-05

## 2018-01-29 LAB — COMPREHENSIVE METABOLIC PANEL
Lab: 0.6
Lab: 14
Lab: 142 — ABNORMAL LOW (ref 4.20–5.40)
Lab: 17 — ABNORMAL HIGH (ref 0–14)
Lab: 4.1
Lab: 6.9
Lab: 9
Lab: 92

## 2018-01-29 LAB — LIPID PROFILE
Lab: 129 — ABNORMAL LOW (ref 150–200)
Lab: 181 — ABNORMAL HIGH (ref 98–107)
Lab: 3
Lab: 36 — ABNORMAL LOW (ref 33.0–37.0)
Lab: 37 — ABNORMAL LOW (ref 23–31)
Lab: 61 — ABNORMAL HIGH (ref 9.8–20.1)

## 2018-01-29 LAB — CBC: Lab: 6.5

## 2018-02-05 ENCOUNTER — Encounter: Admit: 2018-02-05 | Discharge: 2018-02-05 | Payer: MEDICARE

## 2018-03-05 ENCOUNTER — Encounter: Admit: 2018-03-05 | Discharge: 2018-03-05 | Payer: MEDICARE

## 2018-03-06 MED ORDER — AMLODIPINE-ATORVASTATIN 5-20 MG PO TAB
ORAL_TABLET | Freq: Every day | ORAL | 0 refills | Status: AC
Start: 2018-03-06 — End: 2018-04-19

## 2018-04-04 LAB — COMPREHENSIVE METABOLIC PANEL
Lab: 0.4
Lab: 13
Lab: 141 — ABNORMAL LOW (ref 4.20–5.40)
Lab: 15 — ABNORMAL HIGH (ref 0–14)
Lab: 4
Lab: 53 — ABNORMAL LOW (ref >59)
Lab: 6.8
Lab: 8
Lab: 94

## 2018-04-04 LAB — LIPID PROFILE
Lab: 139 — ABNORMAL LOW (ref 150–200)
Lab: 29 — ABNORMAL LOW (ref 33.0–37.0)
Lab: 3
Lab: 48 — ABNORMAL HIGH (ref 80.0–99.0)
Lab: 64 — ABNORMAL HIGH (ref 9.8–20.1)

## 2018-04-04 LAB — THYROID STIMULATING HORMONE-TSH: Lab: 2 — ABNORMAL HIGH (ref 98–107)

## 2018-04-04 LAB — CBC: Lab: 7.1

## 2018-04-19 ENCOUNTER — Encounter: Admit: 2018-04-19 | Discharge: 2018-04-19 | Payer: MEDICARE

## 2018-04-19 MED ORDER — AMLODIPINE-ATORVASTATIN 5-20 MG PO TAB
ORAL_TABLET | Freq: Every day | ORAL | 11 refills | Status: AC
Start: 2018-04-19 — End: 2018-04-23

## 2018-04-22 ENCOUNTER — Encounter: Admit: 2018-04-22 | Discharge: 2018-04-22 | Payer: MEDICARE

## 2018-04-22 MED ORDER — LOSARTAN 100 MG PO TAB
ORAL_TABLET | Freq: Every day | ORAL | 3 refills | 30.00000 days | Status: AC
Start: 2018-04-22 — End: 2018-11-12

## 2018-04-23 ENCOUNTER — Encounter: Admit: 2018-04-23 | Discharge: 2018-04-23 | Payer: MEDICARE

## 2018-04-23 ENCOUNTER — Ambulatory Visit: Admit: 2018-04-23 | Discharge: 2018-04-23 | Payer: MEDICARE

## 2018-04-23 DIAGNOSIS — I639 Cerebral infarction, unspecified: Principal | ICD-10-CM

## 2018-04-23 DIAGNOSIS — I10 Essential (primary) hypertension: ICD-10-CM

## 2018-04-23 DIAGNOSIS — E785 Hyperlipidemia, unspecified: ICD-10-CM

## 2018-04-23 DIAGNOSIS — I4819 Other persistent atrial fibrillation: ICD-10-CM

## 2018-04-23 DIAGNOSIS — R001 Bradycardia, unspecified: Principal | ICD-10-CM

## 2018-04-23 DIAGNOSIS — N289 Disorder of kidney and ureter, unspecified: ICD-10-CM

## 2018-04-23 MED ORDER — AMLODIPINE-ATORVASTATIN 5-20 MG PO TAB
1 | ORAL_TABLET | Freq: Every day | ORAL | 11 refills | Status: AC
Start: 2018-04-23 — End: 2018-04-23

## 2018-04-23 MED ORDER — AMLODIPINE-ATORVASTATIN 5-20 MG PO TAB
1 | ORAL_TABLET | Freq: Every day | ORAL | 3 refills | Status: AC
Start: 2018-04-23 — End: 2019-04-24

## 2018-04-29 ENCOUNTER — Encounter: Admit: 2018-04-29 | Discharge: 2018-04-29 | Payer: MEDICARE

## 2018-04-29 MED ORDER — MUCINEX 600 MG PO TA12
ORAL_TABLET | Freq: Two times a day (BID) | 2 refills | PRN
Start: 2018-04-29 — End: ?

## 2018-05-02 LAB — BASIC METABOLIC PANEL
Lab: 0.8
Lab: 130 — ABNORMAL HIGH (ref 70–105)
Lab: 14
Lab: 25 — ABNORMAL HIGH (ref 7.0–18.7)
Lab: 9.6

## 2018-05-03 ENCOUNTER — Encounter: Admit: 2018-05-03 | Discharge: 2018-05-03 | Payer: MEDICARE

## 2018-05-04 ENCOUNTER — Encounter: Admit: 2018-05-04 | Discharge: 2018-05-04 | Payer: MEDICARE

## 2018-05-04 MED ORDER — MUCINEX 600 MG PO TA12
ORAL_TABLET | Freq: Two times a day (BID) | 2 refills | PRN
Start: 2018-05-04 — End: ?

## 2018-05-13 ENCOUNTER — Encounter: Admit: 2018-05-13 | Discharge: 2018-05-13 | Payer: MEDICARE

## 2018-05-13 MED ORDER — FUROSEMIDE 40 MG PO TAB
ORAL_TABLET | Freq: Every day | ORAL | 3 refills | 90.00000 days | Status: AC
Start: 2018-05-13 — End: 2018-11-28

## 2018-05-13 MED ORDER — SPIRONOLACTONE 25 MG PO TAB
75 mg | ORAL_TABLET | Freq: Every day | ORAL | 3 refills | 46.00000 days | Status: AC
Start: 2018-05-13 — End: 2018-11-11

## 2018-06-04 ENCOUNTER — Encounter: Admit: 2018-06-04 | Discharge: 2018-06-04 | Payer: MEDICARE

## 2018-06-04 MED ORDER — BENZONATATE 100 MG PO CAP
ORAL_CAPSULE | Freq: Three times a day (TID) | 2 refills | PRN
Start: 2018-06-04 — End: ?

## 2018-08-08 ENCOUNTER — Encounter: Admit: 2018-08-08 | Discharge: 2018-08-08 | Payer: MEDICARE

## 2018-08-09 MED ORDER — ARTHRITIS PAIN RELIEF (ACETAM) 650 MG PO TBER
ORAL_TABLET | 0 refills | PRN
Start: 2018-08-09 — End: ?

## 2018-09-20 ENCOUNTER — Encounter: Admit: 2018-09-20 | Discharge: 2018-09-20 | Payer: MEDICARE

## 2018-09-20 DIAGNOSIS — I712 Thoracic aortic aneurysm, without rupture: Principal | ICD-10-CM

## 2018-09-25 NOTE — Telephone Encounter
[place] scheduling is requesting copy of patient's insurance before they will schedule CT chest.  [facility] x2 for patient asking for a return call to get copy.

## 2018-10-01 ENCOUNTER — Encounter: Admit: 2018-10-01 | Discharge: 2018-10-01 | Payer: MEDICARE

## 2018-10-01 MED ORDER — METOPROLOL TARTRATE 25 MG PO TAB
ORAL_TABLET | Freq: Every day | ORAL | 10 refills | 90.00000 days | Status: DC
Start: 2018-10-01 — End: 2019-09-04

## 2018-10-03 ENCOUNTER — Encounter: Admit: 2018-10-03 | Discharge: 2018-10-03 | Payer: MEDICARE

## 2018-10-03 NOTE — Telephone Encounter
10/03/2018 12:40 PM     I received a message from the CT scheduling department at [facility]  [phone]. They have questions about the ordere they received from [place].  I tried to call back and had to leave a message. Morene Crocker, RN

## 2018-10-16 ENCOUNTER — Encounter: Admit: 2018-10-16 | Discharge: 2018-10-16

## 2018-10-16 NOTE — Progress Notes
CT Chest done at [facility].  Sent for scanning.  CBP to review at upcoming OV.

## 2018-10-29 ENCOUNTER — Encounter: Admit: 2018-10-29 | Discharge: 2018-10-29

## 2018-10-29 MED ORDER — XARELTO 20 MG PO TAB
ORAL_TABLET | Freq: Every day | ORAL | 11 refills | 30.00000 days | Status: DC
Start: 2018-10-29 — End: 2019-10-14

## 2018-10-29 NOTE — Telephone Encounter
Received a request via computer from the patients pharmacy requesting a refill.  Script e-scribed as requested.

## 2018-11-04 ENCOUNTER — Encounter: Admit: 2018-11-04 | Discharge: 2018-11-04

## 2018-11-05 ENCOUNTER — Encounter: Admit: 2018-11-05 | Discharge: 2018-11-05

## 2018-11-05 ENCOUNTER — Ambulatory Visit: Admit: 2018-11-05 | Discharge: 2018-11-06

## 2018-11-05 DIAGNOSIS — I10 Essential (primary) hypertension: Secondary | ICD-10-CM

## 2018-11-05 DIAGNOSIS — E785 Hyperlipidemia, unspecified: Secondary | ICD-10-CM

## 2018-11-05 DIAGNOSIS — I639 Cerebral infarction, unspecified: Secondary | ICD-10-CM

## 2018-11-05 DIAGNOSIS — N289 Disorder of kidney and ureter, unspecified: Secondary | ICD-10-CM

## 2018-11-05 DIAGNOSIS — I4819 Other persistent atrial fibrillation: Secondary | ICD-10-CM

## 2018-11-05 DIAGNOSIS — E782 Mixed hyperlipidemia: Secondary | ICD-10-CM

## 2018-11-05 DIAGNOSIS — I712 Thoracic aortic aneurysm, without rupture: Secondary | ICD-10-CM

## 2018-11-05 DIAGNOSIS — Z7901 Long term (current) use of anticoagulants: Secondary | ICD-10-CM

## 2018-11-05 NOTE — Progress Notes
Date of Service: 11/05/2018    Ann Ibarra is a 78 y.o. female.       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  She is seeing Dr. Wendy Poet who works at the [facility] for primary care although Dr. Verlee Monte principal practice is in [place].     I have followed her closely with CT scans looking for progressive dilation but as her last scan October 16, 2018 her aorta remained unchanged from prior scans at about 4.3 cm.  She also had a 4 mm nodule unchanged from prior scans noted on the CT.    From a symptom standpoint things are stable.  She has had intermittent atrial fibrillation but she is really never felt it or had hemodynamic consequences.    Her lipid profile at the end of last year looked good with LDL under 70 HDL over 40 and triglycerides under 150.   04/04/2018    Cholesterol 139 (L)   Triglycerides 146   HDL 48   LDL 64     She inquired about her recent lab work from June 4.  Told her things look good with the exception of her BUN and creatinine which were both a bit higher than previous suggesting some volume contraction/dehydration.  She says that she is limiting her intake orally because she does not want to have to urinate that much.   10/17/2018    Sodium 142   Potassium 4.5   Chloride 112 (H)   CO2 22.0 (L)   Blood Urea Nitrogen 49.0 (H)   Creatinine 1.37 (H)   Glucose 88   Albumin 3.7   Calcium 8.8   Total Bilirubin 0.41   Total Protein 6.0 (L)   AST (SGOT) 13   ALT (SGPT) 10   Alk Phosphatase 95          Vitals:    11/05/18 1302 11/05/18 1321   BP: 130/72 128/70   BP Source: Arm, Left Upper Arm, Right Upper   Pulse: 68    SpO2: 98%    Weight: 86.2 kg (190 lb)    Height: 1.626 m (5' 4)    PainSc: Zero      Body mass index is 32.61 kg/m???.     Past Medical History  Patient Active Problem List    Diagnosis Date Noted ??? Bilateral leg edema 03/09/2015   ??? Persistent atrial fibrillation (HCC) 12/08/2014     atrial fibrillation rapid ventricular response 07/2014 on admitting EKG Strep pneumonia sepsis  [facility]   atrial fibrillation controlled ventricular response MAC OV 12/08/14.  on diltiazem 240, lopressor 25 BID.       ??? Bradycardia, drug induced 11/26/2013     11/26/13 progress note~Pt took excess of labetalol (900mg  instead of 300mg ) and syncope + LOC which has since resolved   01/06/14 Sinus bradycardia rate 45 BP 160/90 on labetalol 100 mg BID, Labetalol DC'd.  MAC OV [facility].     ??? 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  2/02-CT of chest/abdomen:Ao stable in size, 3.8cm AP x 3.4cm transverse stable  c.  06/20/00 CT  chest abdomen: Flap visible distal to L subclavian to infrarenal, no change vs 2/02  d.  7/05 CT chest abdomen Ascending: 4 cm,  descending 3.5 cm stable vs 4/04  e.12/10/04 CT Chest/abd: Ascending 4.0, descending 3.5 cm max spans same as 7/05  f. 04/06/06 CT aorta: ascending: 4.0 cm dia, descending 3.5 cm w/ stable Type B dissection,                Prox abd. aorta 2.9 cm  No change when compared to prior 7/06 CT survey  G. August, 2011. The max dia   4.09 cm, stable Type B dissection.   H 07/2012 CT Chest [facility]: 4.1 cm ascending aorta, Stable Type B Aortic Dissection  I 02/24/14:  CT Chest [facility]: Stable aortic dissection Max Dia Ascending Ao 4.0 cm, Descending thoracic  Ao 3.8 to 4.0 unchanged from 07/2012.   I 3/16 Stable aorta CT chest  [facility] w/ admit eval for pneumonia.   February 2018 CT without contrast span aorta 4.3 cm  June 2019: CT without contrast 4.3 cm unchanged from CT of July 07, 2016. ??? 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  Initial defects: R Hemianopia, hemiparesis,ataxia. Reduced intellectual capacity> CarotidUS-OK       b.  2002 Residual defects in reflexes, higher mental function, memory and common sense     ??? Hypertension 10/21/2008      a.  1/02 BP 160+, Zestril increased to 40 BID. labetalol, norvasc, cardura cont'd. BP to 110-20       b.  Began donating blood multiple times annually in 2002, BP normal at donations.  C. 03/2011 Labetalol 600 BID reduced to 300 BID when resting heart rate noted to be 41 on routine MAC OV.      ??? Hyperlipidemia 10/21/2008     a.   7/00-Total 194, Trig 150, HDL 50, LDL 114-Zocor 10mg        b.  03/28/00 total 161 trig 131 HDL 51 LDL 84 AST/ALT ok, Zocor 10,             >change to lipitor 10 mg/d 5/02 for cost reduction       c.  11/23/00 total 144 trig 109 HDL 53 LDL 69  Lipitor 10  D. 08/24/09 total 153 trigs 108 HDL 54 LDL 78 Lipitor 20     ??? Observation for suspected coronary artery disease (CAD) 10/21/2008     a.  07/14/2001 2.5 minute stress echo stopping with legs giving out Submaximal heart rate,             EF 70+ % with improvement with exercise, no ischemia. 6.  GI issues Hiatal hernia       a.  2/02-CT scan of chest small hiatal hernia, abdomen: descending colon diverticulosis     ??? Renal insufficiency 10/21/2008     a.  5/99 Abnormal renal flow scan [facility]: bilateral insufficiency, Left lower pole atrophy               Review of Systems   Constitution: Negative.   HENT: Negative.    Eyes: Negative.    Cardiovascular: Negative.    Respiratory: Negative.    Endocrine: Negative.    Hematologic/Lymphatic: Negative.    Skin: Negative.    Musculoskeletal: Positive for arthritis.   Gastrointestinal: Negative.    Genitourinary: Negative.  Neurological: Negative.    Psychiatric/Behavioral: Negative.    Allergic/Immunologic: Negative. 14 organ system review noted. It is negative except as reported in current narrative or above in the ROS section. This is a patient centered review of systems that was stated by the patient in her terms prior to my personal problem oriented interview with the patient     Physical Exam  Examined  in wheelchair at her request.   General: Patient in no distress, looks generally healthy. Skin warm and dry, non icteric, Wearing medical face mask     Mucous membranes moist.  Pupils equal and round  Carotids: no bruits    Thyroid not enlarged.  Neck veins: CVP looks normal  Respiratory: Breathing comfortably. Lungs clear to percussion . No rales/rhonchi. Scattered late expiratory wheezes.  Cardiac: Basically slow regular rhythm with occasional prematurity..Normal S1 & S2, Fourth heart sound, no rub or S3. No murmur  Abdomen: soft, non-tender,  + bowel sounds.   Motor: Normal muscle strength gait not tested.  No asymmetric weakness.   Cognitive: Pleasant demeanor. Good insight. No depression   She has minimal if any leg edema. She is wearing tight socke to upper calf not  compression socks    Cardiovascular Studies  Today's 12 lead EKG: sinus rhythm, rate 60 normal     Problems Addressed Today  No diagnosis found.    Assessment and Plan     Tanicia is doing extremely well.  Her blood pressure looks good.  She is not having chest pain or shortness of breath.  Arthritis is her main limiting factor regarding quality of life and physical activity.  Her aorta has been stable for years.  I do not think we need to follow with CT scan more often than every 2 or 3 years although there is no guideline.  Right now with her excellent blood pressure control I think we can plan on her next CT scan to be in 2023.    He is on amlodipine 7.5 with no edema.  She is tolerating Cardura with no major side effects are drowsiness.  Losartan 100 is well-tolerated as is her Toprol-XL and spironolactone.  I reviewed her lab work and see a slight rise in BUN and creatinine suggesting she needs to hydrate a little more.  I do not think we need any special follow-up on that.    Good blood pressure control is the key to preventing growth of her slightly dilated aorta.  I like to see her again in 6 months for blood pressure and general cardiovascular health check.  We will check BMP and hemoglobin 8 and CBC again at that time.  She is it within the range of hemoglobin she has been in for the last couple of years but is now on the lower side.  She is not having any symptoms of anemia.  We will recheck the CBC along with the CMP in 6 months.    NB: The free text in this document was generated through Dragon(TM) software with editing and proofreading  done by the author of this document Dr. Mable Paris MD, [facility] principally at the point of care. Some errors may persist.  If there are questions about content in this document please contact Dr. Hale Bogus.    The written information I provided Ms. Cervini at the conclusion of today's encounter is as  follows:    Patient Instructions   Overall things look good.  Your cholesterol control is excellent with bad cholesterol  under 70.  Your blood pressure is good as the reading is under 130/70.  Your aorta is unchanged with this span 4.3 cm.  This is been present about this size for years.  Let us plan another CT scan in 2023.    When I initially met you after your stroke in 1999 I would have thought it was too much to ask that you go 21 more years with no complications but that is what we have seen.  We got a winning formula now [place] stay with what were doing.  Call in if anything changes.  If not I will see you again in 6 months.    Blood test we need to get just before you come into the office are  CMP CBC and TSH.    Call in if you have problems or questions.   Marissa Nestle, MD               Current Medications (including today's revisions) ??? acetaminophen (TYLENOL) 325 mg tablet Take 650 mg by mouth twice daily.   ??? albuterol-ipratropium (DUO-NEB) 0.5 mg-3 mg(2.5 mg base)/3 mL nebulizer solution Inhale 3 mL solution by nebulizer as directed twice daily.   ??? amLODIPine (NORVASC) 2.5 mg tablet Take one tablet by mouth daily.   ??? amlodipine-atorvastatatin (CADUET) 5-20 mg tablet Take one tablet by mouth daily.   ??? benzonatate (TESSALON) 200 mg capsule Take 200 mg by mouth three times daily as needed for Cough.   ??? diphenhydrAMINE (BENADRYL) 50 mg capsule Take 50 mg by mouth every 6 hours as needed.   ??? docusate (COLACE) 100 mg capsule Take 100 mg by mouth daily as needed for Constipation.   ??? doxazosin (CARDURA) 2 mg tablet TAKE 1 TABLET BY MOUTH TWICE DAILY   ??? furosemide (LASIX) 40 mg tablet TAKE 1 TABLET BY MOUTH EVERY DAY   ??? guaiFENesin LA (MUCINEX) 600 mg tablet Take 600 mg by mouth daily.   ??? losartan (COZAAR) 100 mg tablet TAKE 1 TABLET BY MOUTH EVERY DAY *NEED TO ESTABLISH WITH A NEW PROVIDER*   ??? metoprolol tartrate (LOPRESSOR) 25 mg tablet TAKE 1/2 TABLET BY MOUTH EVERY DAY   ??? spironolactone (ALDACTONE) 25 mg tablet Take three tablets by mouth daily.   ??? XARELTO 20 mg tablet TAKE 1 TABLET BY MOUTH EVERY DAY

## 2018-11-05 NOTE — Patient Instructions
Overall things look good.  Your cholesterol control is excellent with bad cholesterol under 70.  Your blood pressure is good as the reading is under 130/70.  Your aorta is unchanged with this span 4.3 cm.  This is been present about this size for years.  Let us plan another CT scan in 2023.    When I initially met you after your stroke in 1999 I would have thought it was too much to ask that you go 21 more years with no complications but that is what we have seen.  We got a winning formula now [place] stay with what were doing.  Call in if anything changes.  If not I will see you again in 6 months.    Blood test we need to get just before you come into the office are  CMP CBC and TSH.    Call in if you have problems or questions.   Lenoard Aden, MD

## 2018-11-09 ENCOUNTER — Encounter: Admit: 2018-11-09 | Discharge: 2018-11-09

## 2018-11-11 MED ORDER — SPIRONOLACTONE 25 MG PO TAB
ORAL_TABLET | Freq: Every day | ORAL | 3 refills | 46.00000 days | Status: DC
Start: 2018-11-11 — End: 2019-04-07

## 2018-11-11 MED ORDER — DOXAZOSIN 2 MG PO TAB
2 mg | ORAL_TABLET | Freq: Two times a day (BID) | ORAL | 3 refills | 30.00000 days | Status: DC
Start: 2018-11-11 — End: 2019-07-21

## 2018-11-12 ENCOUNTER — Encounter: Admit: 2018-11-12 | Discharge: 2018-11-12

## 2018-11-12 MED ORDER — LOSARTAN 100 MG PO TAB
ORAL_TABLET | Freq: Every day | ORAL | 3 refills | 30.00000 days | Status: DC
Start: 2018-11-12 — End: 2019-07-21

## 2018-11-27 ENCOUNTER — Encounter: Admit: 2018-11-27 | Discharge: 2018-11-27

## 2018-11-28 MED ORDER — FUROSEMIDE 40 MG PO TAB
ORAL_TABLET | Freq: Every day | ORAL | 3 refills | 90.00000 days | Status: DC
Start: 2018-11-28 — End: 2019-07-21

## 2019-03-27 ENCOUNTER — Encounter: Admit: 2019-03-27 | Discharge: 2019-03-27 | Payer: MEDICARE

## 2019-03-27 DIAGNOSIS — I4819 Other persistent atrial fibrillation: Secondary | ICD-10-CM

## 2019-03-27 DIAGNOSIS — I10 Essential (primary) hypertension: Secondary | ICD-10-CM

## 2019-03-27 DIAGNOSIS — I712 Thoracic aortic aneurysm, without rupture: Secondary | ICD-10-CM

## 2019-03-27 DIAGNOSIS — E782 Mixed hyperlipidemia: Secondary | ICD-10-CM

## 2019-04-01 ENCOUNTER — Encounter: Admit: 2019-04-01 | Discharge: 2019-04-01 | Payer: MEDICARE

## 2019-04-01 NOTE — Telephone Encounter
Ann Ibarra at [facility] left message asking for a return call re: lab orders.  Attempted to call her back, but she is gone for the day.  Will try again tomorrow.  She didn't leave a detailed message so unsure of what she needed.       Ann Ibarra # [phone]

## 2019-04-03 ENCOUNTER — Encounter: Admit: 2019-04-03 | Discharge: 2019-04-03 | Payer: MEDICARE

## 2019-04-03 DIAGNOSIS — I4819 Other persistent atrial fibrillation: Secondary | ICD-10-CM

## 2019-04-03 DIAGNOSIS — E782 Mixed hyperlipidemia: Secondary | ICD-10-CM

## 2019-04-03 DIAGNOSIS — I712 Thoracic aortic aneurysm, without rupture: Secondary | ICD-10-CM

## 2019-04-03 DIAGNOSIS — I10 Essential (primary) hypertension: Secondary | ICD-10-CM

## 2019-04-03 LAB — CBC
Lab: 188
Lab: 3.6 — ABNORMAL LOW (ref 4.20–5.40)
Lab: 11 — ABNORMAL LOW (ref 12.0–16.0)
Lab: 12
Lab: 31 — ABNORMAL HIGH (ref 27.0–31.0)
Lab: 32
Lab: 35 — ABNORMAL LOW (ref 37.0–47.0)
Lab: 6.7 — ABNORMAL HIGH (ref 3.5–5.1)
Lab: 97 — ABNORMAL HIGH (ref 0.57–1.11)

## 2019-04-03 LAB — COMPREHENSIVE METABOLIC PANEL
Lab: 121
Lab: 13
Lab: 141
Lab: 16 — ABNORMAL HIGH (ref 0–14)
Lab: 8

## 2019-04-03 LAB — THYROID STIMULATING HORMONE-TSH: Lab: 1.9

## 2019-04-07 MED ORDER — SPIRONOLACTONE 25 MG PO TAB
50 mg | ORAL_TABLET | Freq: Every day | ORAL | 3 refills | 90.00000 days | Status: DC
Start: 2019-04-07 — End: 2019-11-03

## 2019-04-18 ENCOUNTER — Encounter: Admit: 2019-04-18 | Discharge: 2019-04-18 | Payer: MEDICARE

## 2019-04-24 ENCOUNTER — Encounter: Admit: 2019-04-24 | Discharge: 2019-04-24 | Payer: MEDICARE

## 2019-04-24 MED ORDER — AMLODIPINE-ATORVASTATIN 5-20 MG PO TAB
ORAL_TABLET | Freq: Every day | ORAL | 3 refills | Status: AC
Start: 2019-04-24 — End: ?

## 2019-04-24 MED ORDER — SPIRONOLACTONE 25 MG PO TAB
ORAL_TABLET | Freq: Every day | 3 refills
Start: 2019-04-24 — End: ?

## 2019-07-19 ENCOUNTER — Encounter: Admit: 2019-07-19 | Discharge: 2019-07-19 | Payer: MEDICARE

## 2019-07-21 MED ORDER — FUROSEMIDE 40 MG PO TAB
ORAL_TABLET | Freq: Every day | ORAL | 3 refills | 90.00000 days | Status: AC
Start: 2019-07-21 — End: ?

## 2019-07-21 MED ORDER — LOSARTAN 100 MG PO TAB
ORAL_TABLET | Freq: Every day | ORAL | 3 refills | 90.00000 days | Status: AC
Start: 2019-07-21 — End: ?

## 2019-07-21 MED ORDER — DOXAZOSIN 2 MG PO TAB
2 mg | ORAL_TABLET | Freq: Two times a day (BID) | ORAL | 3 refills | 30.00000 days | Status: AC
Start: 2019-07-21 — End: ?

## 2019-09-04 MED ORDER — METOPROLOL TARTRATE 25 MG PO TAB
ORAL_TABLET | Freq: Every day | ORAL | 10 refills | 90.00000 days | Status: AC
Start: 2019-09-04 — End: ?

## 2019-09-29 ENCOUNTER — Encounter: Admit: 2019-09-29 | Discharge: 2019-09-29 | Payer: MEDICARE

## 2019-09-29 NOTE — Progress Notes
CT Chest and Abdomen - 09/19/19 at [facility]    Impression:  Unchanged appearance of the thoracic aorta.  This measures up to 4.2cm at the proximal arch.  Abdominal Aorta is normal in caliber.  Moderate hiatal hernia  Partially imaged sigmoid diverticulosis.  Interval changes of the kidneys.    Report sent to scanning

## 2019-10-14 ENCOUNTER — Encounter: Admit: 2019-10-14 | Discharge: 2019-10-14 | Payer: MEDICARE

## 2019-10-14 MED ORDER — RIVAROXABAN 20 MG PO TAB
20 mg | ORAL_TABLET | Freq: Every day | ORAL | 11 refills | 30.00000 days | Status: AC
Start: 2019-10-14 — End: ?

## 2019-11-03 ENCOUNTER — Encounter: Admit: 2019-11-03 | Discharge: 2019-11-03 | Payer: MEDICARE

## 2019-11-03 MED ORDER — SPIRONOLACTONE 25 MG PO TAB
ORAL_TABLET | Freq: Every day | ORAL | 3 refills | 90.00000 days | Status: AC
Start: 2019-11-03 — End: ?

## 2020-03-11 ENCOUNTER — Encounter: Admit: 2020-03-11 | Discharge: 2020-03-11 | Payer: MEDICARE

## 2020-03-11 MED ORDER — AMLODIPINE-ATORVASTATIN 5-20 MG PO TAB
1 | ORAL_TABLET | Freq: Every day | ORAL | 3 refills | Status: AC
Start: 2020-03-11 — End: ?

## 2020-03-28 MED ORDER — AMLODIPINE-ATORVASTATIN 5-20 MG PO TAB
ORAL_TABLET | Freq: Every day | 3 refills
Start: 2020-03-28 — End: ?

## 2020-07-04 MED ORDER — FUROSEMIDE 40 MG PO TAB
ORAL_TABLET | Freq: Every day | 3 refills
Start: 2020-07-04 — End: ?

## 2020-07-04 MED ORDER — DOXAZOSIN 2 MG PO TAB
2 mg | ORAL_TABLET | Freq: Two times a day (BID) | ORAL | 3 refills
Start: 2020-07-04 — End: ?

## 2020-07-16 ENCOUNTER — Encounter: Admit: 2020-07-16 | Discharge: 2020-07-16 | Payer: MEDICARE

## 2020-07-18 ENCOUNTER — Encounter: Admit: 2020-07-18 | Discharge: 2020-07-18 | Payer: MEDICARE

## 2020-07-18 MED ORDER — LOSARTAN 100 MG PO TAB
ORAL_TABLET | Freq: Every day | 3 refills
Start: 2020-07-18 — End: ?

## 2020-08-03 ENCOUNTER — Encounter: Admit: 2020-08-03 | Discharge: 2020-08-03 | Payer: MEDICARE

## 2020-08-03 MED ORDER — METOPROLOL TARTRATE 25 MG PO TAB
ORAL_TABLET | Freq: Every day | ORAL | 10 refills | 90.00000 days | Status: AC
Start: 2020-08-03 — End: ?

## 2020-08-19 ENCOUNTER — Encounter: Admit: 2020-08-19 | Discharge: 2020-08-19 | Payer: MEDICARE

## 2020-08-19 DIAGNOSIS — I4819 Other persistent atrial fibrillation: Secondary | ICD-10-CM

## 2020-08-19 DIAGNOSIS — I10 Essential (primary) hypertension: Secondary | ICD-10-CM

## 2020-08-19 DIAGNOSIS — I712 Thoracic aortic aneurysm, without rupture: Secondary | ICD-10-CM

## 2020-08-19 DIAGNOSIS — Z7901 Long term (current) use of anticoagulants: Secondary | ICD-10-CM

## 2020-08-19 DIAGNOSIS — I5032 Chronic diastolic (congestive) heart failure: Secondary | ICD-10-CM

## 2020-08-19 DIAGNOSIS — E785 Hyperlipidemia, unspecified: Secondary | ICD-10-CM

## 2020-08-19 DIAGNOSIS — I639 Cerebral infarction, unspecified: Secondary | ICD-10-CM

## 2020-08-19 DIAGNOSIS — N289 Disorder of kidney and ureter, unspecified: Secondary | ICD-10-CM

## 2020-08-19 MED ORDER — DOXAZOSIN 2 MG PO TAB
2 mg | ORAL_TABLET | Freq: Two times a day (BID) | ORAL | 3 refills | 30.00000 days | Status: AC
Start: 2020-08-19 — End: ?

## 2020-08-19 MED ORDER — METOPROLOL TARTRATE 25 MG PO TAB
12.5 mg | ORAL_TABLET | Freq: Every day | ORAL | 3 refills | 90.00000 days | Status: AC
Start: 2020-08-19 — End: ?

## 2020-08-19 MED ORDER — RIVAROXABAN 20 MG PO TAB
20 mg | ORAL_TABLET | Freq: Every day | ORAL | 3 refills | 30.00000 days | Status: AC
Start: 2020-08-19 — End: ?

## 2020-08-19 MED ORDER — AMLODIPINE-ATORVASTATIN 5-20 MG PO TAB
1 | ORAL_TABLET | Freq: Every day | ORAL | 3 refills | Status: AC
Start: 2020-08-19 — End: ?

## 2020-08-19 MED ORDER — BENZONATATE 200 MG PO CAP
200 mg | ORAL_CAPSULE | ORAL | 1 refills | 9.00000 days | Status: AC
Start: 2020-08-19 — End: ?

## 2020-08-19 MED ORDER — LOSARTAN 100 MG PO TAB
100 mg | ORAL_TABLET | Freq: Every day | ORAL | 3 refills | 30.00000 days | Status: AC
Start: 2020-08-19 — End: ?

## 2020-08-19 MED ORDER — FUROSEMIDE 40 MG PO TAB
40 mg | ORAL_TABLET | ORAL | 3 refills | 90.00000 days | Status: AC
Start: 2020-08-19 — End: ?

## 2020-08-20 ENCOUNTER — Encounter: Admit: 2020-08-20 | Discharge: 2020-08-20 | Payer: MEDICARE

## 2020-08-20 MED ORDER — BENZONATATE 200 MG PO CAP
ORAL_CAPSULE | Freq: Three times a day (TID) | PRN refills | PRN
Start: 2020-08-20 — End: ?

## 2020-08-20 MED ORDER — BENZONATATE 200 MG PO CAP
200 mg | ORAL_CAPSULE | ORAL | 1 refills
Start: 2020-08-20 — End: ?

## 2020-08-24 ENCOUNTER — Encounter: Admit: 2020-08-24 | Discharge: 2020-08-24 | Payer: MEDICARE

## 2020-08-24 DIAGNOSIS — I4819 Other persistent atrial fibrillation: Secondary | ICD-10-CM

## 2020-08-24 DIAGNOSIS — I5032 Chronic diastolic (congestive) heart failure: Secondary | ICD-10-CM

## 2020-08-24 DIAGNOSIS — I10 Essential (primary) hypertension: Secondary | ICD-10-CM

## 2020-08-24 DIAGNOSIS — I712 Thoracic aortic aneurysm, without rupture: Secondary | ICD-10-CM

## 2020-08-24 DIAGNOSIS — Z7901 Long term (current) use of anticoagulants: Secondary | ICD-10-CM

## 2020-08-24 LAB — CBC
Lab: 10 — ABNORMAL LOW (ref 12.0–16.0)
Lab: 18 — ABNORMAL HIGH (ref 11.5–14.5)
Lab: 181
Lab: 27
Lab: 3.6 — ABNORMAL LOW (ref 4.20–5.40)
Lab: 30
Lab: 33 — ABNORMAL LOW (ref 37.0–47.0)
Lab: 5.4
Lab: 91 — ABNORMAL HIGH (ref 0.57–1.11)

## 2020-08-24 LAB — COMPREHENSIVE METABOLIC PANEL
Lab: 13
Lab: 132
Lab: 14
Lab: 142
Lab: 17

## 2020-08-24 LAB — IRON + BINDING CAPACITY + %SAT+ FERRITIN: Lab: 42 — ABNORMAL LOW (ref 50–170)

## 2020-08-24 LAB — LIPID PROFILE: Lab: 109 — ABNORMAL LOW (ref 20–55)

## 2020-08-25 ENCOUNTER — Encounter: Admit: 2020-08-25 | Discharge: 2020-08-25 | Payer: MEDICARE

## 2020-08-25 MED ORDER — FERRIC CARBOXYMALTOSE IVPB
750 mg | Freq: Once | INTRAVENOUS | 1 refills | Status: AC
Start: 2020-08-25 — End: ?

## 2020-08-25 NOTE — Telephone Encounter
-----   Message from Michiel Cowboy, MD sent at 08/24/2020  5:00 PM CDT -----  Qualifies for IV iron, can we arrange infusion at [facility]? Dose is Two rounds of injectafer 750 mg scheduled 1 week apart.   I think it will help the anemia and help her feel better.   The HF [facility] in [facility] may be able to assist with the routine orders we send to our infusion center if you od not have ability to order, alternatively sometimes we have PCP order it.   Thanks.  JST  ----- Message -----  From: Weston Brass  Sent: 08/24/2020   4:25 PM CDT  To: Michiel Cowboy, MD    Labs for your review and recommendations

## 2020-09-09 ENCOUNTER — Encounter: Admit: 2020-09-09 | Discharge: 2020-09-09 | Payer: MEDICARE

## 2020-09-09 DIAGNOSIS — I639 Cerebral infarction, unspecified: Secondary | ICD-10-CM

## 2020-09-09 DIAGNOSIS — N289 Disorder of kidney and ureter, unspecified: Secondary | ICD-10-CM

## 2020-09-09 DIAGNOSIS — E785 Hyperlipidemia, unspecified: Secondary | ICD-10-CM

## 2020-09-09 DIAGNOSIS — I10 Essential (primary) hypertension: Secondary | ICD-10-CM

## 2020-09-13 ENCOUNTER — Encounter: Admit: 2020-09-13 | Discharge: 2020-09-13 | Payer: MEDICARE

## 2020-09-13 MED ORDER — BENZONATATE 200 MG PO CAP
200 mg | ORAL_CAPSULE | ORAL | 1 refills
Start: 2020-09-13 — End: ?

## 2021-08-03 ENCOUNTER — Encounter: Admit: 2021-08-03 | Discharge: 2021-08-03 | Payer: MEDICARE

## 2021-08-03 DIAGNOSIS — I712 Thoracic aortic aneurysm without rupture, unspecified part (HCC): Secondary | ICD-10-CM

## 2021-08-03 DIAGNOSIS — I4819 Other persistent atrial fibrillation: Secondary | ICD-10-CM

## 2021-08-03 DIAGNOSIS — I10 Essential (primary) hypertension: Secondary | ICD-10-CM

## 2021-08-03 NOTE — Progress Notes
Records Request-STAT    Medical records request for continuation of care:    Patient has appointment  with  Dr. Sandria Manly    Please fax records to [facility] [place] [facility] [phone]    Request records:    Tisa Weisel  1940-12-05    PLEASE CLOUD ALL ECHO IMAGES FROM 5.2022  CT ABD/PEL-5.2022  US RENAL5.2022    ASAP    Thank you,      Cardiovascular Medicine  [facility]  [address]  [place], [place] [address]  Phone:  [phone]  Fax:  [phone]

## 2021-08-03 NOTE — Progress Notes
Patient Instructions   1. Med changes today:  Take lasix twice daily for two days, then go back to once daily lasix dosing.  2. we will refill BP medications and Xarelto. I can also offer refill liquid robitussin for cough and I am fine if you have tessalon or mucinex as well.  3. Please write down your weight, blood pressure, and heart rate daily and bring this log to all clinic visits.  4. Labs can be collected at assisted living within the next month: CBC CMP, lipids, iron/ferritin.    5. Return in a year, we will repeat chest CT in [place] at that time to verify your aneurysm is stable.

## 2021-08-09 ENCOUNTER — Encounter: Admit: 2021-08-09 | Discharge: 2021-08-09 | Payer: MEDICARE

## 2021-08-09 DIAGNOSIS — I639 Cerebral infarction, unspecified: Secondary | ICD-10-CM

## 2021-08-09 DIAGNOSIS — R0989 Other specified symptoms and signs involving the circulatory and respiratory systems: Secondary | ICD-10-CM

## 2021-08-09 DIAGNOSIS — I4819 Other persistent atrial fibrillation: Secondary | ICD-10-CM

## 2021-08-09 DIAGNOSIS — I10 Essential (primary) hypertension: Secondary | ICD-10-CM

## 2021-08-09 DIAGNOSIS — E785 Hyperlipidemia, unspecified: Secondary | ICD-10-CM

## 2021-08-09 DIAGNOSIS — Z7901 Long term (current) use of anticoagulants: Secondary | ICD-10-CM

## 2021-08-09 DIAGNOSIS — I712 Thoracic aortic aneurysm without rupture (HCC): Secondary | ICD-10-CM

## 2021-08-09 DIAGNOSIS — I5032 Chronic diastolic (congestive) heart failure: Secondary | ICD-10-CM

## 2021-08-09 DIAGNOSIS — N289 Disorder of kidney and ureter, unspecified: Secondary | ICD-10-CM

## 2021-08-09 LAB — BNP (B-TYPE NATRIURETIC PEPTI): BNP: 174 — ABNORMAL HIGH (ref 0–100)

## 2021-08-09 LAB — COMPREHENSIVE METABOLIC PANEL
ALBUMIN: 3.8
ALK PHOSPHATASE: 107
ALT: 12
ANION GAP: 10
AST: 13
BLD UREA NITROGEN: 25 — ABNORMAL HIGH (ref 9.8–20.1)
CALCIUM: 9.2
CO2: 22 — ABNORMAL LOW (ref 23–31)
CREATININE: 0.9
GLUCOSE,PANEL: 86
SODIUM: 142
TOTAL BILIRUBIN: 0.6
TOTAL PROTEIN: 6.7

## 2021-08-09 NOTE — Patient Instructions
Thank you for visiting our office today.    We would like to make the following medication adjustments:      Have baseline lab work today    Increase Lasix to 40mg  twice daily for one week. Have repeat labs drawn, including fasting labs.      Otherwise continue the same medications as you have been doing.          We will be pursuing the following tests after your appointment today:       Orders Placed This Encounter    COMPREHENSIVE METABOLIC PANEL    BNP (B-TYPE NATRIURETIC PEPTI)    COMPREHENSIVE METABOLIC PANEL    BNP (B-TYPE NATRIURETIC PEPTI)    LIPID PROFILE    ECG 12-LEAD    2D + DOPPLER ECHO         We will plan to see you back in 6 months.  Please call in the meantime with any questions or concerns.        Please allow 5-7 business days for our providers to review your results. All normal results will go to MyChart. If you do not have Mychart, it is strongly recommended to get this so you can easily view all your results. If you do not have mychart, we will attempt to call you once with normal lab and testing results. If we cannot reach you by phone with normal results, we will send you a letter.  If you have not heard the results of your testing after one week please give [place] a call.       Your Cardiovascular Medicine [place] Team Gabriel Rung, Brett Canales, Pilar Jarvis, and [place])  phone number is [phone].

## 2021-08-09 NOTE — Progress Notes
Reviewed with WTL in clinic and he has no further recommendations at this time.

## 2021-08-16 ENCOUNTER — Encounter: Admit: 2021-08-16 | Discharge: 2021-08-16 | Payer: MEDICARE

## 2021-08-16 DIAGNOSIS — Z7901 Long term (current) use of anticoagulants: Secondary | ICD-10-CM

## 2021-08-16 DIAGNOSIS — I5032 Chronic diastolic (congestive) heart failure: Secondary | ICD-10-CM

## 2021-08-16 DIAGNOSIS — I4819 Other persistent atrial fibrillation: Secondary | ICD-10-CM

## 2021-08-16 DIAGNOSIS — I10 Essential (primary) hypertension: Secondary | ICD-10-CM

## 2021-08-16 DIAGNOSIS — I712 Thoracic aortic aneurysm without rupture (HCC): Secondary | ICD-10-CM

## 2021-08-16 DIAGNOSIS — R0989 Other specified symptoms and signs involving the circulatory and respiratory systems: Secondary | ICD-10-CM

## 2021-08-16 LAB — COMPREHENSIVE METABOLIC PANEL
ALK PHOSPHATASE: 86 K/UL — ABNORMAL HIGH (ref 0–0.45)
ANION GAP: 9
AST: 11 K/UL (ref 0–0.20)
BLD UREA NITROGEN: 32 U/L — ABNORMAL HIGH (ref 9.8–20.1)
CHLORIDE: 108 mg/dL — ABNORMAL HIGH (ref 98–107)
CO2: 26 g/dL (ref 3.5–5.0)
CREATININE: 1 U/L (ref 7–40)
GFR ESTIMATED: 55 — ABNORMAL LOW (ref >59)
GLUCOSE,PANEL: 79 MMOL/L (ref 21–30)
POTASSIUM: 3.9 g/dL (ref 6.0–8.0)
TOTAL PROTEIN: 6.4 K/UL — ABNORMAL HIGH (ref 3–12)

## 2021-08-16 LAB — LIPID PROFILE
CHOLESTEROL/HDL %: 4
CHOLESTEROL: 142
HDL: 36 — ABNORMAL LOW (ref >=40)
LDL: 76
TRIGLYCERIDES: 151 — ABNORMAL HIGH (ref <150)
VLDL: 30

## 2021-08-16 LAB — BNP (B-TYPE NATRIURETIC PEPTI): BNP: 119 — ABNORMAL HIGH (ref 0–100)

## 2021-08-17 ENCOUNTER — Encounter: Admit: 2021-08-17 | Discharge: 2021-08-17 | Payer: MEDICARE

## 2021-08-22 IMAGING — CT CHESTWO
2 of 4 series · 13 of 36 positions shown, 16 images · non-contrast
Comparison: none

[Series 2: thorax ax 1.50 br40 s3 · axial · 0.58mm/px · z∈[+1543,+1780]mm · 10 of 183 slices shown, 13 images]
[im 15/183  mediastinal]
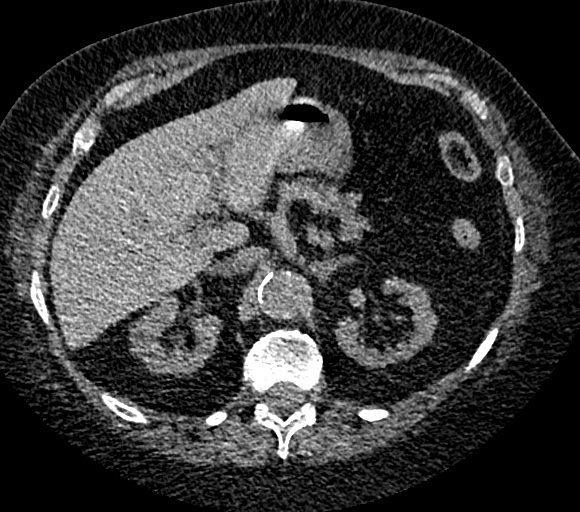
[im 15/183  lung]
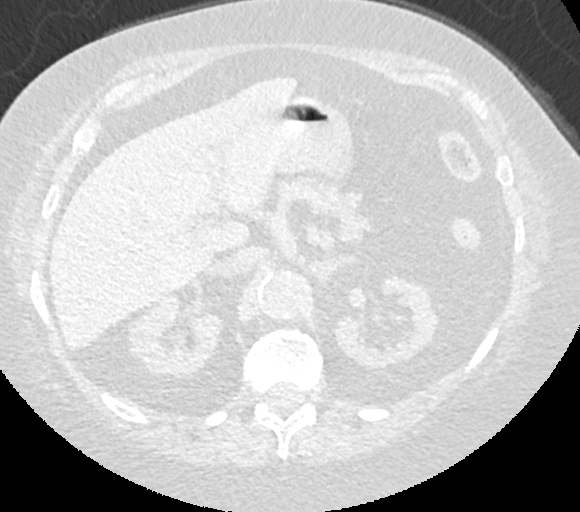
[im 29/183  lung]
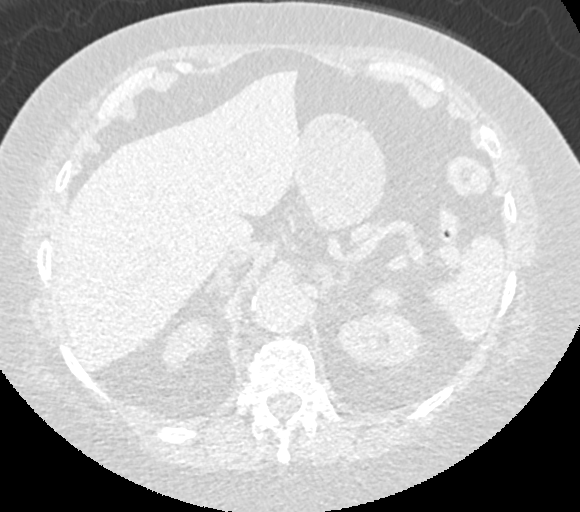
[im 57/183  lung]
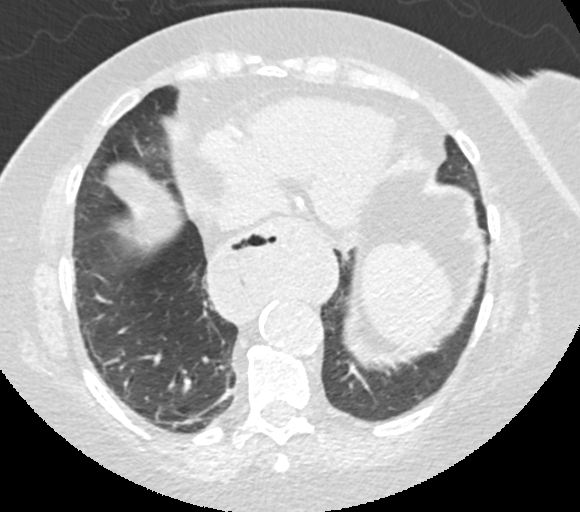
[im 71/183  lung]
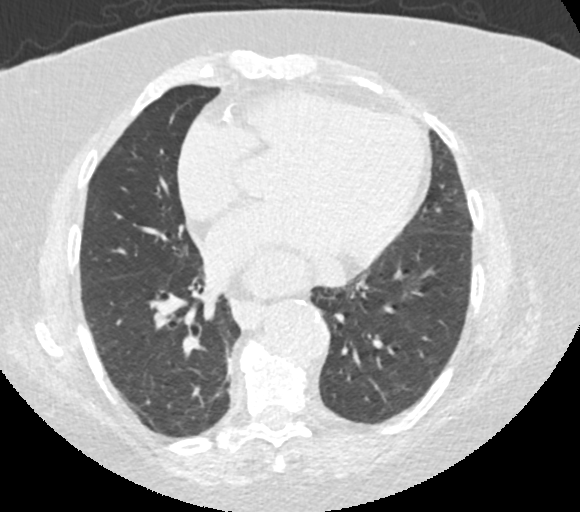
[im 85/183  mediastinal]
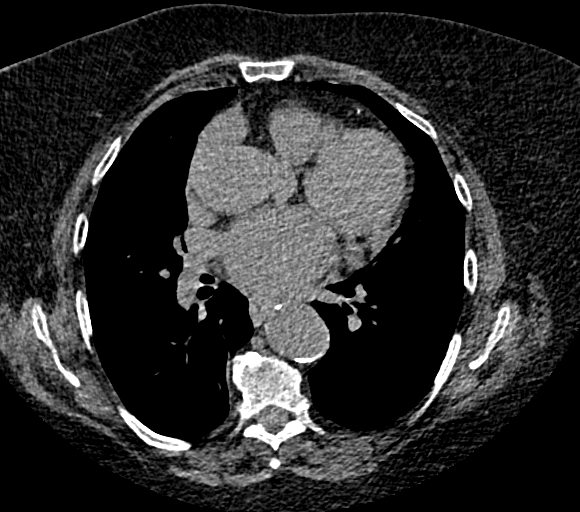
[im 85/183  lung]
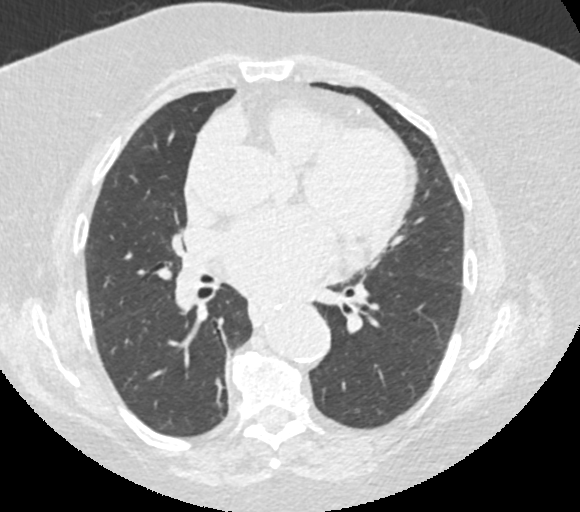
[im 99/183  lung]
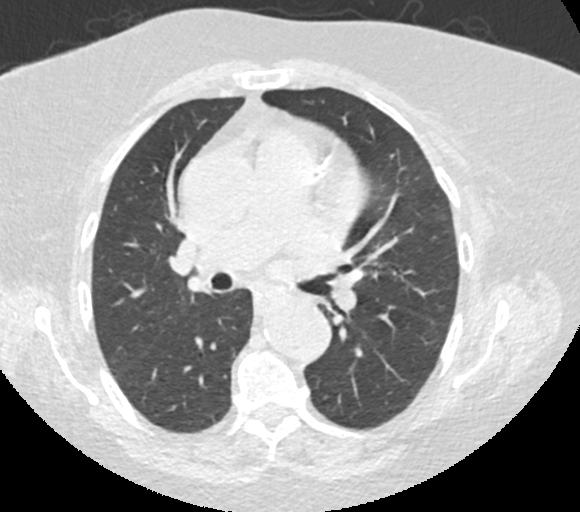
[im 113/183  lung]
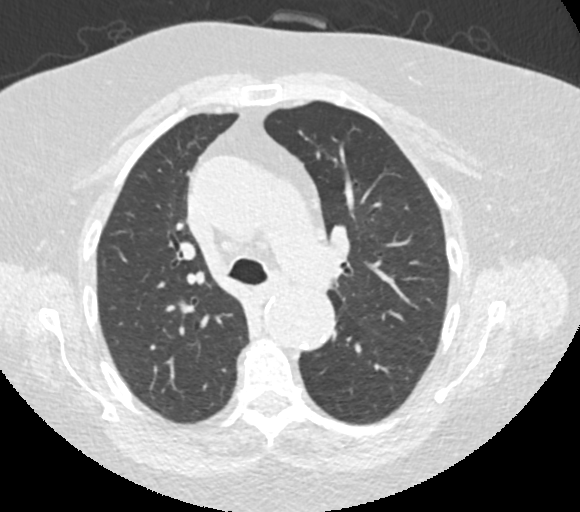
[im 141/183  lung]
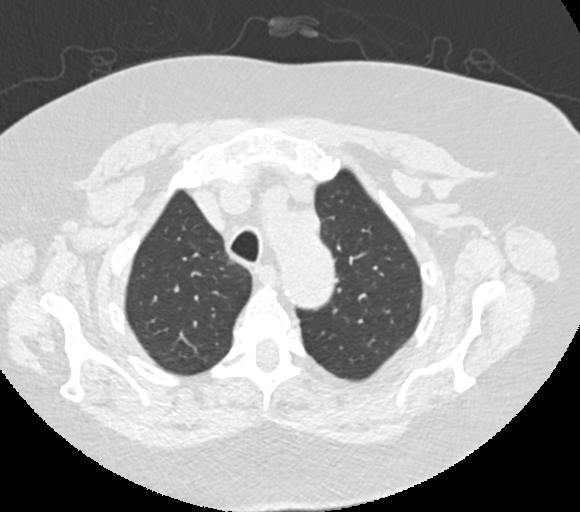
[im 155/183  mediastinal]
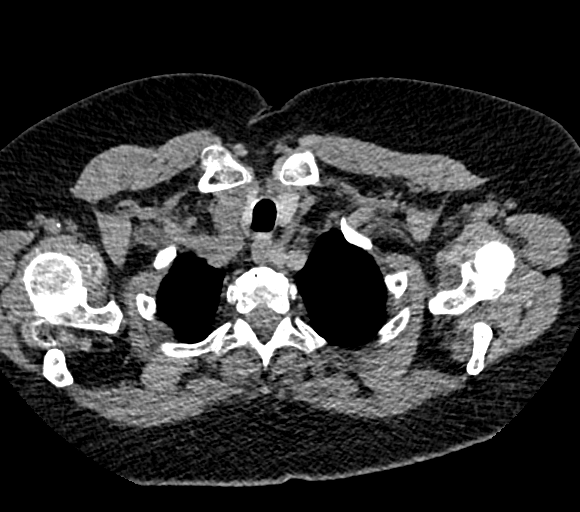
[im 155/183  lung]
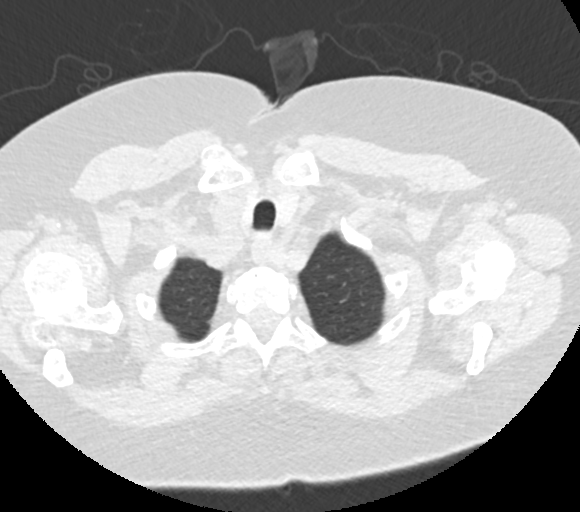
[im 169/183  lung]
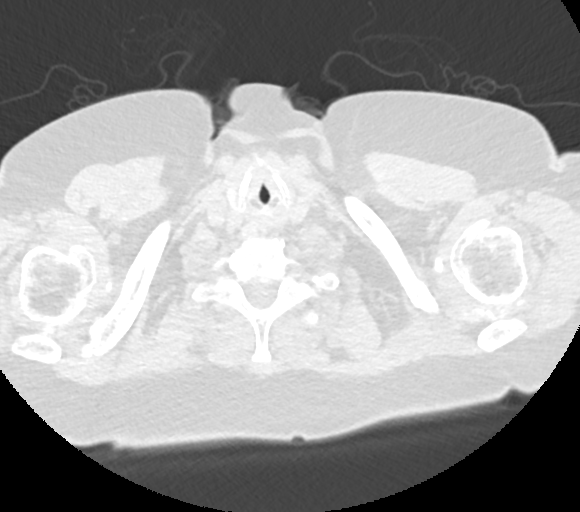

[Series 3: thorax cor 1.50 br40 s3 · coronal · 0.55mm/px · 3 of 193 slices shown]
[im 39/193  lung]
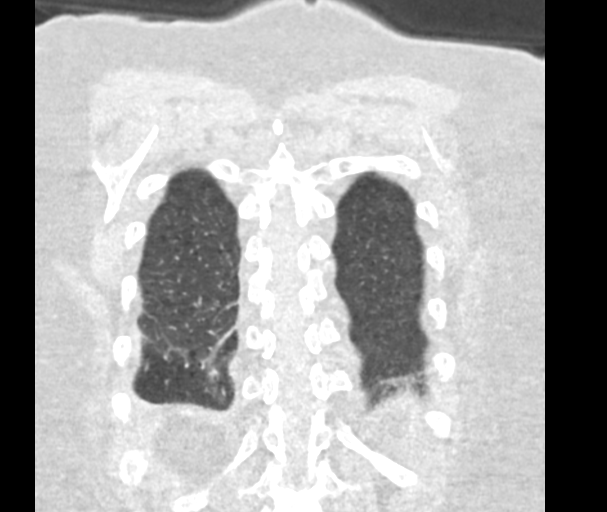
[im 77/193  lung]
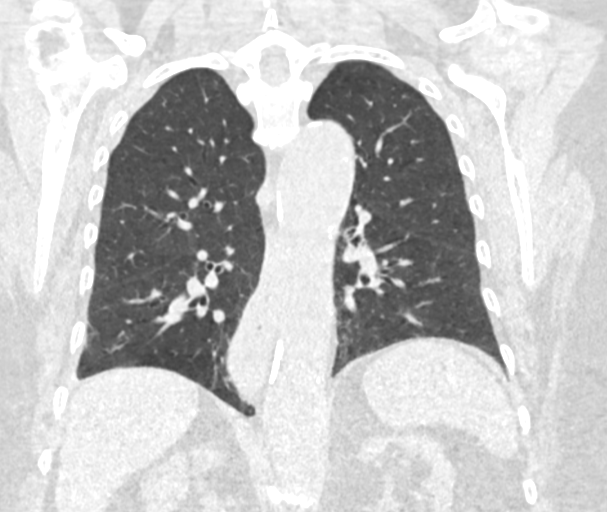
[im 116/193  lung]
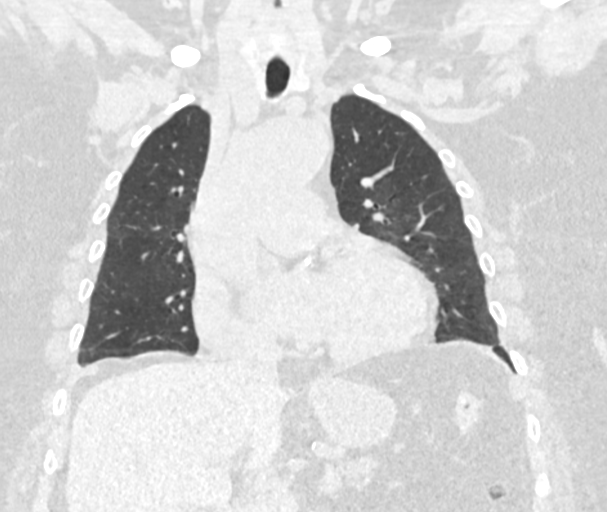

[13 of 36 positions shown; findings below may reference images not displayed]

DIAGNOSTIC STUDIES

EXAM

CT of the chest and abdomen without contrast

INDICATION

Thoracic aortic aneurysm w/o rupture
2 ct/0 nm. thoracic aortic aneurysm. me

TECHNIQUE

All CT scans at this facility use dose modulation, iterative reconstruction, and/or weight based
dosing when appropriate to reduce radiation dose to as low as reasonably achievable.

Number of previous computed tomography exams in the last 12 months is 1.

Number of previous nuclear medicine myocardial perfusion studies in the last 12 months is 0  .

COMPARISONS

October 16, 2018

FINDINGS

CHEST

Mediastinum: Unchanged appearance of the thoracic aorta, on coronal imaging measuring up to
centimeters level of pulmonary artery on image 128. Near the aortic arch this measured up to
centimeters. Findings are unchanged from prior when measured in a similar fashion. The proximal
descending thoracic aorta measures 4.2 centimeters and the distal descending thoracic aorta measures
3.4 centimeters.

Heart size is normal. Coronary artery calcifications. Moderate hiatal hernia. No enlarged
mediastinal nodes by size criteria.

Lungs: Dependent atelectasis. No focal pneumonia, pleural effusion, or pneumothorax.

ABDOMEN

Liver: Normal appearing.

Gallbladder: Normal appearing.

Bile ducts: No intra or extrahepatic biliary ductal dilation.

Spleen: Normal appearing.

Pancreas: Atrophic changes.

Adrenals: Normal appearing.

Kidneys: Atrophic changes.

Vasculature: The abdominal aorta is normal in caliber with mild atheromatous plaque.

Bowel: The imaged bowel loops are not dilated. Partially imaged sigmoid diverticulosis.

Free fluid: None.

Lymph nodes: No enlarged nodes in the abdomen by size criteria.

Osseous structures: No aggressive osseous lesions. Degenerative changes of the spine.

IMPRESSION

Unchanged appearance of the thoracic aorta. This measures up to 4.2 centimeters at the proximal
arch.

Abdominal aorta is normal in caliber.

Moderate hiatal hernia.

Partially imaged sigmoid diverticulosis.

Interval changes of the kidneys.

Tech Notes:

2 ct/0 nm. thoracic aortic aneurysm. me

## 2021-08-24 ENCOUNTER — Encounter: Admit: 2021-08-24 | Discharge: 2021-08-24 | Payer: MEDICARE

## 2021-08-24 NOTE — Telephone Encounter
-----   Message from Altamease Oiler, MD sent at 08/18/2021  2:00 PM CDT -----  This looks good.  Lets repeat the basic metabolic panel and BNP in 2 weeks.  Thank you  ----- Message -----  From: Florene Route, RN  Sent: 08/16/2021   3:25 PM CDT  To: Altamease Oiler, MD    Labs for your review and recommendations. RN at patient's living facilty states that swelling has improved after increasing lasix to 40mg  BID. Patient reports no symptoms at this time. Pressures are stable around 120s-130s/60s, HR 60s-80s. Thank you!

## 2021-09-01 ENCOUNTER — Encounter: Admit: 2021-09-01 | Discharge: 2021-09-01 | Payer: MEDICARE

## 2021-09-01 DIAGNOSIS — R600 Localized edema: Secondary | ICD-10-CM

## 2021-09-01 DIAGNOSIS — I10 Essential (primary) hypertension: Secondary | ICD-10-CM

## 2021-09-01 DIAGNOSIS — E782 Mixed hyperlipidemia: Secondary | ICD-10-CM

## 2021-09-01 DIAGNOSIS — I4819 Other persistent atrial fibrillation: Secondary | ICD-10-CM

## 2021-09-10 ENCOUNTER — Encounter: Admit: 2021-09-10 | Discharge: 2021-09-10 | Payer: MEDICARE

## 2021-09-10 MED ORDER — FUROSEMIDE 40 MG PO TAB
ORAL_TABLET | 0 refills
Start: 2021-09-10 — End: ?

## 2021-09-12 ENCOUNTER — Encounter: Admit: 2021-09-12 | Discharge: 2021-09-12 | Payer: MEDICARE

## 2021-09-12 MED ORDER — DOXAZOSIN 2 MG PO TAB
2 mg | ORAL_TABLET | Freq: Two times a day (BID) | ORAL | 3 refills | 30.00000 days | Status: AC
Start: 2021-09-12 — End: ?

## 2021-09-12 MED ORDER — AMLODIPINE-ATORVASTATIN 5-20 MG PO TAB
1 | ORAL_TABLET | Freq: Every day | ORAL | 3 refills | Status: AC
Start: 2021-09-12 — End: ?

## 2021-09-12 MED ORDER — METOPROLOL TARTRATE 25 MG PO TAB
12.5 mg | ORAL_TABLET | Freq: Every day | ORAL | 3 refills | 90.00000 days | Status: AC
Start: 2021-09-12 — End: ?

## 2021-09-19 ENCOUNTER — Encounter: Admit: 2021-09-19 | Discharge: 2021-09-19 | Payer: MEDICARE

## 2021-09-19 MED ORDER — RIVAROXABAN 20 MG PO TAB
20 mg | ORAL_TABLET | Freq: Every day | ORAL | 3 refills | 30.00000 days | Status: AC
Start: 2021-09-19 — End: ?

## 2021-12-15 ENCOUNTER — Encounter: Admit: 2021-12-15 | Discharge: 2021-12-15 | Payer: MEDICARE

## 2022-02-21 ENCOUNTER — Encounter: Admit: 2022-02-21 | Discharge: 2022-02-21 | Payer: MEDICARE

## 2022-02-28 ENCOUNTER — Encounter: Admit: 2022-02-28 | Discharge: 2022-02-28 | Payer: MEDICARE

## 2022-02-28 DIAGNOSIS — I10 Essential (primary) hypertension: Secondary | ICD-10-CM

## 2022-02-28 DIAGNOSIS — E782 Mixed hyperlipidemia: Secondary | ICD-10-CM

## 2022-02-28 DIAGNOSIS — I4819 Other persistent atrial fibrillation: Secondary | ICD-10-CM

## 2022-02-28 DIAGNOSIS — E785 Hyperlipidemia, unspecified: Secondary | ICD-10-CM

## 2022-02-28 DIAGNOSIS — R001 Bradycardia, unspecified: Secondary | ICD-10-CM

## 2022-02-28 DIAGNOSIS — I639 Cerebral infarction, unspecified: Secondary | ICD-10-CM

## 2022-02-28 DIAGNOSIS — N289 Disorder of kidney and ureter, unspecified: Secondary | ICD-10-CM

## 2022-02-28 NOTE — Progress Notes
Date of Service: 02/28/2022    Ann Ibarra is a 81 y.o. female.       Chief Complaint: Follow-up    History of Present Illness:     I had the pleasure of seeing Ann Ibarra in our [place] office this morning for cardiovascular followup.  She has historically followed with Bland Span, MD.   ?  As you know, [place] is a truly delightful 81 year old female with history of hypertension, chronic kidney disease, thoracic aortic aneurysm, heart failure with preserved ejection fraction, and permanent atrial fibrillation.     I last saw Ann Ibarra about 6 months ago.  At that time she had some lower extremity swelling.  We had her take a higher dose of Lasix for 7 days.  Sounds like this helped quite a bit.  She now has just some very mild lower extremity swelling.  No shortness of breath, orthopnea, or paroxysmal nocturnal dyspnea.    Ann Ibarra tells me recently she has been getting along great.  Her facility has been decorated for Halloween which she really enjoys.  She also watches the chiefs.    Most recent cardiovascular testing includes an echocardiogram performed November 27, 2013.  This demonstrated normal left ventricular systolic function.  Ejection fraction 55%.  Mild left ventricular hypertrophy with sigmoid septum.  No wall motion abnormalities.  Normal left ventricular size and systolic function.  No significant valvular disease.  She had a CT of her chest completed after our visit back in March 2023.  This showed her ascending aorta to be stable in size.  4.2 cm.       Past Medical History:  Patient Active Problem List    Diagnosis Date Noted   ? On continuous oral anticoagulation 11/05/2018   ? Bilateral leg edema 03/09/2015   ? Persistent atrial fibrillation (HCC) 12/08/2014     atrial fibrillation rapid ventricular response 07/2014 on admitting EKG Strep pneumonia sepsis  [facility]   atrial fibrillation controlled ventricular response MAC OV 12/08/14.  on diltiazem 240, lopressor 25 BID.       ? Bradycardia, drug induced 11/26/2013     11/26/13 progress note~Pt took excess of labetalol (900mg  instead of 300mg ) and syncope + LOC which has since resolved   01/06/14 Sinus bradycardia rate 45 BP 160/90 on labetalol 100 mg BID, Labetalol DC'd.  MAC OV [place]     ? 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  2/02-CT of chest/abdomen:Ao stable in size, 3.8cm AP x 3.4cm transverse stable  c.  06/20/00 CT chest abdomen: Flap visible distal to L subclavian to infrarenal, no change vs 2/02  d.  7/05 CT chest abdomen Ascending: 4 cm,  descending 3.5 cm stable vs 4/04  e.12/10/04 CT Chest/abd: Ascending 4.0, descending 3.5 cm max spans same as 7/05  f. 04/06/06 CT aorta: ascending: 4.0 cm dia, descending 3.5 cm w/ stable Type B dissection,                Prox abd. aorta 2.9 cm  No change when compared to prior 7/06 CT survey  G. August, 2011. The max dia   4.09 cm, stable Type B dissection.   H 07/2012 CT Chest [facility]: 4.1 cm ascending aorta, Stable Type B Aortic Dissection  I 02/24/14:  CT Chest [facility]: Stable aortic dissection Max Dia Ascending Ao 4.0 cm, Descending thoracic  Ao 3.8 to 4.0 unchanged from 07/2012.   I 3/16 Stable aorta CT chest  [facility] w/ admit eval for pneumonia.   February 2018 CT without contrast span aorta 4.3 cm  June 2019: CT without contrast 4.3 cm unchanged from CT of July 07, 2016.       ? 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  Initial defects: R Hemianopia, hemiparesis,ataxia. Reduced intellectual capacity> CarotidUS-OK       b.  2002 Residual defects in reflexes, higher mental function, memory and common sense     ? Hypertension 10/21/2008      a.  1/02 BP 160+, Zestril increased to 40 BID. labetalol, norvasc, cardura cont'd. BP to 110-20       b.  Began donating blood multiple times annually in 2002, BP normal at donations.  C. 03/2011 Labetalol 600 BID reduced to 300 BID when resting heart rate noted to be 41 on routine MAC OV.      ? Hyperlipidemia 10/21/2008     a.   7/00-Total 194, Trig 150, HDL 50, LDL 114-Zocor 10mg        b.  03/28/00 total 161 trig 131 HDL 51 LDL 84 AST/ALT ok, Zocor 10,             >change to lipitor 10 mg/d 5/02 for cost reduction       c.  11/23/00 total 144 trig 109 HDL 53 LDL 69  Lipitor 10  D. 08/24/09 total 153 trigs 108 HDL 54 LDL 78 Lipitor 20     ? Observation for suspected coronary artery disease (CAD) 10/21/2008     a.  07/14/2001 2.5 minute stress echo stopping with legs giving out Submaximal heart rate,             EF 70+ % with improvement with exercise, no ischemia. 6.  GI issues Hiatal hernia       a.  2/02-CT scan of chest small hiatal hernia, abdomen: descending colon diverticulosis     ? Renal insufficiency 10/21/2008     a.  5/99 Abnormal renal flow scan [facility]: bilateral insufficiency, Left lower pole atrophy             Review of Systems   Constitutional: Negative.   HENT: Negative.    Eyes: Negative.    Cardiovascular: Negative.    Respiratory: Negative.    Endocrine: Negative.    Hematologic/Lymphatic: Negative.    Skin: Negative.    Musculoskeletal: Negative.    Gastrointestinal: Negative.    Genitourinary: Negative.    Neurological: Negative.    Psychiatric/Behavioral: Negative.    Allergic/Immunologic: Negative.        Vitals:    02/28/22 0922   BP: (!) 140/89  Comment: automatic cuff   BP Source: Arm, Left Upper   Pulse: 82   SpO2: 96%   O2 Device: None (Room air)   PainSc: Zero   Weight: 89.8 kg (198 lb)   Height: 162.6 cm (5' 4)     Body mass index is 33.99 kg/m?Marland Kitchen    Physical Examination:  General Appearance: No acute distress. Fully alert and oriented.  Skin: Warm. No ulcers or xanthomas.   HEENT: Grossly unremarkable. Lips and oral mucosa without pallor or cyanosis. Moist mucous membranes.  Neck Veins: Normal jugular venous pressure. Neck veins are not distended.  Carotid Arteries: Normal carotid upstroke bilaterally. No bruits.  Chest Inspection: Chest is normal in appearance.  Auscultation/Percussion: Normal respiratory effort. Lungs clear to auscultation bilaterally. No wheezes, rales, or rhonchi.    Cardiac Rhythm: Regular rhythm. Normal rate.  Cardiac Auscultation: Normal S1 & S2. No S3 or S4. No rub.  Murmurs: No cardiac murmurs.  Peripheral Circulation: Normal peripheral circulation.   Abdominal Aorta: No abdominal aortic bruit.  Extremities: Appropriately warm to touch.  1+ pitting bilateral lower extremity edema.  Abdominal Exam: Soft, non-tender. No masses, no organomegaly. Normal bowel sounds.  Neurologic Exam: Neurological assessment grossly intact.       Assessment and Plan:  1.  Heart failure with preserved ejection fraction: She has some mild lower extremity swelling on physical exam today.  She is not having orthopnea, paroxysmal nocturnal dyspnea, or shortness of breath.  After last visit we had increased her diuretics for about 7 days.  She is asymptomatic I think we will go ahead and continue her current dose of furosemide.  No changes today.    2.  Permanent atrial fibrillation: Rates are well controlled.  She is on anticoagulation with apixaban 5 mg twice daily.  Continue metoprolol 12.5 mg twice daily.  In the past it sounds like she has had issues with bradycardia on beta-blockers but seems to be tolerating the metoprolol just fine.    3.  Hypertension: Well-controlled.  Continue current regimen.    4.  Dyslipidemia:  I think her goal LDL is less than 70.  Most recent fasting lipid panel is from 12/13/2021.  LDL at that time was 76.  Continue current dose of atorvastatin 20 mg nightly.  Repeat fasting lipid panel in 3 months.  If not at goal consider increasing atorvastatin to 40 mg nightly.    5.  Thoracic aortic aneurysm: Stable.  Recent CT chest in March 2023 showed ascending aorta 4.2 cm.  No significant change.  Plan to repeat CT chest in 2 to 3 years.             Total time spent on today's office visit was 35 minutes. This includes face-to-face in person visit with patient as well as non face-to-face time including review of the electronic medical record, outside records, labs, radiologic studies, cardiovascular studies, formulation of treatment plan, after visit summary, future disposition, personal discussions, and documentation.    Current Medications (including today's revisions)  ? acetaminophen (TYLENOL) 325 mg tablet Take two tablets by mouth twice daily.   ? albuterol-ipratropium (DUO-NEB) 0.5 mg-3 mg(2.5 mg base)/3 mL nebulizer solution Inhale 3 mL solution by nebulizer as directed twice daily.   ? amLODIPine-atorvastatin (CADUET) 5-20 mg tablet Take one tablet by mouth daily.   ? doxazosin (CARDURA) 2 mg tablet Take one tablet by mouth twice daily.   ? ELIQUIS 2.5 mg tablet Take one tablet by mouth daily.   ? furosemide (LASIX) 40 mg tablet Take one tablet by mouth daily.   ? losartan (COZAAR) 100 mg tablet Take one tablet by mouth daily. (Patient taking differently: Take one-half tablet by mouth daily.)   ? metoprolol tartrate 25 mg tablet Take one-half tablet by mouth daily.   ? potassium chloride (K-DUR) 10 mEq tablet Take one tablet by mouth daily. Take with a meal and a full glass of water.   ? rivaroxaban (XARELTO) 20 mg tablet Take one tablet by mouth daily. Take with food.

## 2022-02-28 NOTE — Patient Instructions
MUCINEX AS NEEDED  FOLLOW UP I N1 YEAR    Follow up as directed.  Call sooner if issues.  Call the [place] line at [phone].  Leave a detailed message for the nurse in [place] with how we can assist you and we will call you back.

## 2022-10-17 ENCOUNTER — Encounter: Admit: 2022-10-17 | Discharge: 2022-10-17 | Payer: MEDICARE

## 2023-04-20 ENCOUNTER — Encounter: Admit: 2023-04-20 | Discharge: 2023-04-20 | Payer: MEDICARE

## 2023-05-01 ENCOUNTER — Encounter: Admit: 2023-05-01 | Discharge: 2023-05-01 | Payer: MEDICARE

## 2023-05-01 ENCOUNTER — Ambulatory Visit: Admit: 2023-05-01 | Discharge: 2023-05-02 | Payer: MEDICARE

## 2023-05-07 ENCOUNTER — Encounter: Admit: 2023-05-07 | Discharge: 2023-05-07 | Payer: MEDICARE

## 2023-05-07 DIAGNOSIS — I4819 Other persistent atrial fibrillation: Secondary | ICD-10-CM

## 2023-05-07 DIAGNOSIS — E782 Mixed hyperlipidemia: Secondary | ICD-10-CM

## 2023-05-07 DIAGNOSIS — Z136 Encounter for screening for cardiovascular disorders: Secondary | ICD-10-CM

## 2023-05-07 DIAGNOSIS — I10 Essential (primary) hypertension: Secondary | ICD-10-CM

## 2023-05-07 DIAGNOSIS — R001 Bradycardia, unspecified: Secondary | ICD-10-CM

## 2023-05-07 LAB — BASIC METABOLIC PANEL
ANION GAP: 11
BLD UREA NITROGEN: 21 — ABNORMAL HIGH (ref 9.8–20.1)
CALCIUM: 9.2
CHLORIDE: 107
CO2: 23
CREATININE: 0.8
GFR ESTIMATED: 68
GLUCOSE,PANEL: 89
POTASSIUM: 3.9
SODIUM: 141

## 2023-05-29 ENCOUNTER — Encounter: Admit: 2023-05-29 | Discharge: 2023-05-29 | Payer: MEDICARE

## 2024-05-20 ENCOUNTER — Encounter: Admit: 2024-05-20 | Discharge: 2024-05-20 | Payer: MEDICARE

## 2024-06-03 ENCOUNTER — Encounter: Admit: 2024-06-03 | Discharge: 2024-06-03 | Payer: MEDICARE

## 2024-06-03 DIAGNOSIS — R001 Bradycardia, unspecified: Secondary | ICD-10-CM

## 2024-06-03 DIAGNOSIS — I10 Essential (primary) hypertension: Principal | ICD-10-CM

## 2024-06-03 DIAGNOSIS — Z136 Encounter for screening for cardiovascular disorders: Secondary | ICD-10-CM

## 2024-06-03 DIAGNOSIS — E782 Mixed hyperlipidemia: Secondary | ICD-10-CM

## 2024-06-03 DIAGNOSIS — I4819 Other persistent atrial fibrillation: Secondary | ICD-10-CM

## 2024-06-03 LAB — BASIC METABOLIC PANEL
ANION GAP: 15
BLD UREA NITROGEN: 19
CALCIUM: 9.1
CHLORIDE: 108 — ABNORMAL HIGH (ref 98–107)
CO2: 21 — ABNORMAL LOW (ref 23–31)
CREATININE: 0.8
GFR ESTIMATED: 65
GLUCOSE,PANEL: 100
POTASSIUM: 4.1
SODIUM: 144

## 2024-06-03 LAB — NT-PRO-BNP: NT PROBNP: 209 — ABNORMAL HIGH (ref 0–100)

## 2024-06-13 ENCOUNTER — Encounter: Admit: 2024-06-13 | Discharge: 2024-06-13 | Payer: MEDICARE

## 2024-06-13 ENCOUNTER — Ambulatory Visit: Admit: 2024-06-13 | Discharge: 2024-06-13 | Payer: MEDICARE
